# Patient Record
Sex: Female | Born: 1990
Health system: Southern US, Community
[De-identification: ages and names within clinical notes are randomized; demographics above are authoritative.]

## PROBLEM LIST (undated history)

## (undated) DIAGNOSIS — E282 Polycystic ovarian syndrome: Secondary | ICD-10-CM

## (undated) HISTORY — DX: Polycystic ovarian syndrome: E28.2

## (undated) HISTORY — PX: JOINT REPLACEMENT: SHX530

---
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
## 2013-07-10 ENCOUNTER — Encounter (HOSPITAL_COMMUNITY): Payer: Self-pay | Admitting: Emergency Medicine

## 2013-07-10 ENCOUNTER — Emergency Department (HOSPITAL_COMMUNITY)
Admission: EM | Admit: 2013-07-10 | Discharge: 2013-07-10 | Disposition: A | Discharge status: Home / Self Care | Payer: BC Managed Care – PPO | Source: Home / Self Care | Attending: Emergency Medicine | Admitting: Emergency Medicine

## 2013-07-10 DIAGNOSIS — Y9289 Other specified places as the place of occurrence of the external cause: Secondary | ICD-10-CM

## 2013-07-10 DIAGNOSIS — S335XXA Sprain of ligaments of lumbar spine, initial encounter: Secondary | ICD-10-CM

## 2013-07-10 DIAGNOSIS — W19XXXA Unspecified fall, initial encounter: Secondary | ICD-10-CM

## 2013-07-10 DIAGNOSIS — S39012A Strain of muscle, fascia and tendon of lower back, initial encounter: Secondary | ICD-10-CM

## 2013-07-10 MED ORDER — IBUPROFEN 800 MG PO TABS: 800.0000 mg | ORAL_TABLET | Freq: Three times a day (TID) | ORAL | Status: DC | PRN

## 2013-07-10 MED ORDER — CYCLOBENZAPRINE HCL 10 MG PO TABS: 10.0000 mg | ORAL_TABLET | Freq: Three times a day (TID) | ORAL | Status: DC | PRN

## 2013-07-10 NOTE — ED Provider Notes (Signed)
CSN: [id number]     Arrival date & time 07/10/13  4098 History   First MD Initiated Contact with Patient 07/10/13 [phone]     Chief Complaint  Patient presents with  . Back Pain   (Consider location/radiation/quality/duration/timing/severity/associated sxs/prior Treatment) HPI Comments: 23 year old female presents complaining of possible lower back muscle strain. She was bending over while working in a barn earlier today when she felt a sharp cold and had immediate pain across her lower back. She fell to the ground and it took her a few minutes to get up. She is now having pain that is localized to the right side of her lower back. She denies any numbness or weakness in her extremities. No loss of bowel or bladder control. No radiation of the pain down her legs. She has never had this before.  Patient is a 22 y.o. female presenting with back pain.  Back Pain   History reviewed. No pertinent past medical history. History reviewed. No pertinent past surgical history. History reviewed. No pertinent family history. History  Substance Use Topics  . Smoking status: Never Smoker   . Smokeless tobacco: Not on file  . Alcohol Use: Yes   OB History   Grav Para Term Preterm Abortions TAB SAB Ect Mult Living                 Review of Systems  Musculoskeletal: Positive for back pain.  All other systems reviewed and are negative.   Allergies  Sulfa antibiotics  Home Medications   Prior to Admission medications   Medication Sig Start Date End Date Taking? Authorizing Provider  cyclobenzaprine (FLEXERIL) 10 MG tablet Take 1 tablet (10 mg total) by mouth 3 (three) times daily as needed for muscle spasms. 07/10/13   Graylon Good, PA-C  ibuprofen (ADVIL,MOTRIN) 800 MG tablet Take 1 tablet (800 mg total) by mouth every 8 (eight) hours as needed. 07/10/13   Adrian Blackwater Hermine Feria, PA-C   BP 138/84  Pulse 79  Temp(Src) 99.2 F (37.3 C) (Oral)  Resp 18  SpO2 100%  LMP 07/03/2013 Physical Exam   Nursing note and vitals reviewed. Constitutional: She is oriented to person, place, and time. Vital signs are normal. She appears well-developed and well-nourished. No distress.  HENT:  Head: Normocephalic and atraumatic.  Pulmonary/Chest: Effort normal. No respiratory distress.  Musculoskeletal:       Lumbar back: She exhibits decreased range of motion (Decreased flexion), tenderness (very mild tenderness in the right side of her lower back paraspinous musculature) and pain. She exhibits no bony tenderness, no swelling, no edema, no deformity and no spasm.  Neurological: She is alert and oriented to person, place, and time. She has normal strength. No cranial nerve deficit or sensory deficit. She exhibits normal muscle tone. Coordination and gait normal.  Reflex Scores:      Patellar reflexes are 2+ on the right side and 2+ on the left side. Skin: Skin is warm and dry. No rash noted. She is not diaphoretic.  Psychiatric: She has a normal mood and affect. Judgment normal.    ED Course  Procedures (including critical care time) Labs Review Labs Reviewed - No data to display  Imaging Review No results found.   MDM   1. Strain of lumbar paraspinal muscle, initial encounter    Treat with 800 mg ibuprofen and Flexeril when necessary. Ice, rest as needed. Return to work as tolerated. Should be a minor, self-limited problem  Meds ordered this encounter  Medications  .  ibuprofen (ADVIL,MOTRIN) 800 MG tablet    Sig: Take 1 tablet (800 mg total) by mouth every 8 (eight) hours as needed.    Dispense:  30 tablet    Refill:  0    Order Specific Question:  Supervising Provider    Answer:  Lorenz CoasterKELLER, DAVID C [id number]]  . cyclobenzaprine (FLEXERIL) 10 MG tablet    Sig: Take 1 tablet (10 mg total) by mouth 3 (three) times daily as needed for muscle spasms.    Dispense:  21 tablet    Refill:  0    Order Specific Question:  Supervising Provider    Answer:  Lorenz CoasterKELLER, DAVID C [[id number]]          Graylon GoodZachary H Lakendra Helling, PA-C 07/10/13 [phone]

## 2013-07-10 NOTE — Discharge Instructions (Signed)
Back Exercises °Back exercises help treat and prevent back injuries. The goal of back exercises is to increase the strength of your abdominal and back muscles and the flexibility of your back. These exercises should be started when you no longer have back pain. Back exercises include: °· Pelvic Tilt. Lie on your back with your knees bent. Tilt your pelvis until the lower part of your back is against the floor. Hold this position 5 to 10 sec and repeat 5 to 10 times. °· Knee to Chest. Pull first 1 knee up against your chest and hold for 20 to 30 seconds, repeat this with the other knee, and then both knees. This may be done with the other leg straight or bent, whichever feels better. °· Sit-Ups or Curl-Ups. Bend your knees 90 degrees. Start with tilting your pelvis, and do a partial, slow sit-up, lifting your trunk only 30 to 45 degrees off the floor. Take at least 2 to 3 seconds for each sit-up. Do not do sit-ups with your knees out straight. If partial sit-ups are difficult, simply do the above but with only tightening your abdominal muscles and holding it as directed. °· Hip-Lift. Lie on your back with your knees flexed 90 degrees. Push down with your feet and shoulders as you raise your hips a couple inches off the floor; hold for 10 seconds, repeat 5 to 10 times. °· Back arches. Lie on your stomach, propping yourself up on bent elbows. Slowly press on your hands, causing an arch in your low back. Repeat 3 to 5 times. Any initial stiffness and discomfort should lessen with repetition over time. °· Shoulder-Lifts. Lie face down with arms beside your body. Keep hips and torso pressed to floor as you slowly lift your head and shoulders off the floor. °Do not overdo your exercises, especially in the beginning. Exercises may cause you some mild back discomfort which lasts for a few minutes; however, if the pain is more severe, or lasts for more than 15 minutes, do not continue exercises until you see your caregiver.  Improvement with exercise therapy for back problems is slow.  °See your caregivers for assistance with developing a proper back exercise program. °Document Released: 01/27/2004 Document Revised: 03/13/2011 Document Reviewed: 10/20/2010 °ExitCare® Patient Information ©2015 [employer]. This information is not intended to replace advice given to you by your health care provider. Make sure you discuss any questions you have with your health care provider. ° ° °Lumbosacral Strain °Lumbosacral strain is a strain of any of the parts that make up your lumbosacral vertebrae. Your lumbosacral vertebrae are the bones that make up the lower third of your backbone. Your lumbosacral vertebrae are held together by muscles and tough, fibrous tissue (ligaments).  °CAUSES  °A sudden blow to your back can cause lumbosacral strain. Also, anything that causes an excessive stretch of the muscles in the low back can cause this strain. This is typically seen when people exert themselves strenuously, fall, lift heavy objects, bend, or crouch repeatedly. °RISK FACTORS °· Physically demanding work. °· Participation in pushing or pulling sports or sports that require a sudden twist of the back (tennis, golf, baseball). °· Weight lifting. °· Excessive lower back curvature. °· Forward-tilted pelvis. °· Weak back or abdominal muscles or both. °· Tight hamstrings. °SIGNS AND SYMPTOMS  °Lumbosacral strain may cause pain in the area of your injury or pain that moves (radiates) down your leg.  °DIAGNOSIS °Your health care provider can often diagnose lumbosacral strain through a   physical exam. In some cases, you may need tests such as X-ray exams.  °TREATMENT  °Treatment for your lower back injury depends on many factors that your clinician will have to evaluate. However, most treatment will include the use of anti-inflammatory medicines. °HOME CARE INSTRUCTIONS  °· Avoid hard physical activities (tennis, racquetball, waterskiing) if you are not in  proper physical condition for it. This may aggravate or create problems. °· If you have a back problem, avoid sports requiring sudden body movements. Swimming and walking are generally safer activities. °· Maintain good posture. °· Maintain a healthy weight. °· For acute conditions, you may put ice on the injured area. °¨ Put ice in a plastic bag. °¨ Place a towel between your skin and the bag. °¨ Leave the ice on for 20 minutes, 2-3 times a day. °· When the low back starts healing, stretching and strengthening exercises may be recommended. °SEEK MEDICAL CARE IF: °· Your back pain is getting worse. °· You experience severe back pain not relieved with medicines. °SEEK IMMEDIATE MEDICAL CARE IF:  °· You have numbness, tingling, weakness, or problems with the use of your arms or legs. °· There is a change in bowel or bladder control. °· You have increasing pain in any area of the body, including your belly (abdomen). °· You notice shortness of breath, dizziness, or feel faint. °· You feel sick to your stomach (nauseous), are throwing up (vomiting), or become sweaty. °· You notice discoloration of your toes or legs, or your feet get very cold. °MAKE SURE YOU:  °· Understand these instructions. °· Will watch your condition. °· Will get help right away if you are not doing well or get worse. °Document Released: 09/28/2004 Document Revised: 12/24/2012 Document Reviewed: 08/07/2012 °ExitCare® Patient Information ©2015 [employer]. This information is not intended to replace advice given to you by your health care provider. Make sure you discuss any questions you have with your health care provider. ° °

## 2013-07-10 NOTE — ED Provider Notes (Signed)
Medical screening examination/treatment/procedure(s) were performed by non-physician practitioner and as supervising physician I was immediately available for consultation/collaboration.  Annlee Glandon, M.D.  Muriel Wilber C Samiyyah Moffa, MD 07/10/13 0939 

## 2013-07-10 NOTE — ED Notes (Addendum)
Pt reports       She  Bent over       And  Felt  Pain in  Her  Back  Worse  On movement  And  Certain  posistions    denys  Any  Urinary  Symptoms       Pain   Was  Worse  Initially     Better  Now

## 2014-01-07 ENCOUNTER — Encounter: Payer: Self-pay | Admitting: Family Medicine

## 2014-01-07 ENCOUNTER — Ambulatory Visit (INDEPENDENT_AMBULATORY_CARE_PROVIDER_SITE_OTHER): Payer: BLUE CROSS/BLUE SHIELD | Admitting: Family Medicine

## 2014-01-07 VITALS — BP 149/79 | HR 90 | Temp 97.6°F | Ht 63.0 in | Wt 162.0 lb

## 2014-01-07 DIAGNOSIS — G540 Brachial plexus disorders: Secondary | ICD-10-CM

## 2014-01-07 MED ORDER — IBUPROFEN 800 MG PO TABS: 800.0000 mg | ORAL_TABLET | Freq: Three times a day (TID) | ORAL | Status: AC | PRN

## 2014-01-07 NOTE — Patient Instructions (Signed)
Thoracic Outlet Syndrome Thoracic outlet syndrome is a condition caused by the compressing of the nerves and blood vessels between the muscles of the neck and the shoulders. Work activities involving prolonged restricted postures such as carrying heavy shoulder loads, pulling shoulders back and down, or reaching above shoulder level for prolonged periods of time can cause the inflammation (soreness) and swelling of tendons (cord like structures which attach muscle to bone) and muscles in the shoulders and upper arms. When swollen or inflamed, they can compress the nerves and blood vessels between the neck and shoulders.  SYMPTOMS   Pain.  Arm weakness.  Numbness in the arm and fingers.  Sense of touch or the ability to feel heat and cold may be lost. DIAGNOSIS  The diagnosis is made by medical history and physical examination. Special tests can confirm the diagnosis.  TREATMENT   A carefully planned program of exercise therapy.  Applying ice packs for 15-20 minutes, 03-04 times per day, may be useful.  Avoid work activities suspected of causing the condition.  Only take over-the-counter or prescription medicines for pain, discomfort, or fever as directed by your caregiver.  Surgery may be necessary if symptoms persist. PREVENTION   Avoid carrying heavy weights.  Avoid reaching overhead.  Avoid lifting with the arms above shoulder level. SEEK IMMEDIATE MEDICAL CARE IF:   You have an increase in swelling or pain in your arm.  You notice coldness in your fingers.  Pain relief is not obtained with medications. Document Released: 12/09/2001 Document Revised: 03/13/2011 Document Reviewed: 12/19/2004 ExitCare Patient Information 2015 [employer]. This information is not intended to replace advice given to you by your health care provider. Make sure you discuss any questions you have with your health care provider.  

## 2014-01-07 NOTE — Progress Notes (Signed)
.     Subjective:    Patient ID: Heather Arias, female    DOB: 08/21/1990, 24 y.o.   MRN: [id number]  HPI Patient c/o left shoulder and arm discomfort.  She has been lifting heavy bales of hay and she states she gets numb in her finger tips.  Review of Systems  Constitutional: Negative for fever.  HENT: Negative for ear pain.   Eyes: Negative for discharge.  Respiratory: Negative for cough.   Cardiovascular: Negative for chest pain.  Gastrointestinal: Negative for abdominal distention.  Endocrine: Negative for polyuria.  Genitourinary: Negative for difficulty urinating.  Musculoskeletal: Negative for gait problem and neck pain.  Skin: Negative for color change and rash.  Neurological: Negative for speech difficulty and headaches.  Psychiatric/Behavioral: Negative for agitation.       Objective:    BP 149/79 mmHg  Pulse 90  Temp(Src) 97.6 F (36.4 C) (Oral)  Ht 5\' 3"  (1.6 m)  Wt 162 lb (73.483 kg)  BMI 28.70 kg/m2  LMP 12/10/2013 (Approximate) Physical Exam  TTP left deltoid and left SITS muscles.  She has decreased radial pulse and her left hand develops pallor after raising left arm above head. Normal ROM left shoulder and negative NEER and HAWKINS FROM left shoulder without tenderness.      Assessment & Plan:     ICD-9-CM ICD-10-CM   1. Thoracic outlet syndrome 353.0 G54.0   Motrin 800mg  one po tid x 7 days Avoid heavy lifting for a week.   No Follow-up on file.  Deatra CanterWilliam J Karyn Brull FNP

## 2014-01-21 ENCOUNTER — Ambulatory Visit: Payer: Self-pay | Admitting: Family Medicine

## 2014-04-01 ENCOUNTER — Ambulatory Visit (INDEPENDENT_AMBULATORY_CARE_PROVIDER_SITE_OTHER): Payer: BLUE CROSS/BLUE SHIELD | Admitting: Family

## 2014-04-01 ENCOUNTER — Encounter (INDEPENDENT_AMBULATORY_CARE_PROVIDER_SITE_OTHER): Payer: Self-pay

## 2014-04-01 ENCOUNTER — Encounter: Payer: Self-pay | Admitting: Family

## 2014-04-01 VITALS — BP 136/83 | HR 82 | Temp 97.0°F | Ht 63.0 in | Wt 157.8 lb

## 2014-04-01 DIAGNOSIS — R05 Cough: Secondary | ICD-10-CM | POA: Diagnosis not present

## 2014-04-01 DIAGNOSIS — J069 Acute upper respiratory infection, unspecified: Secondary | ICD-10-CM | POA: Diagnosis not present

## 2014-04-01 DIAGNOSIS — G540 Brachial plexus disorders: Secondary | ICD-10-CM

## 2014-04-01 DIAGNOSIS — R059 Cough, unspecified: Secondary | ICD-10-CM

## 2014-04-01 MED ORDER — KETOROLAC TROMETHAMINE 60 MG/2ML IM SOLN
60.0000 mg | Freq: Once | INTRAMUSCULAR | Status: AC
  Administered 2014-04-01: 60 mg via INTRAMUSCULAR

## 2014-04-01 MED ORDER — METHYLPREDNISOLONE (PAK) 4 MG PO TABS: ORAL_TABLET | ORAL | Status: DC

## 2014-04-01 MED ORDER — BENZONATATE 200 MG PO CAPS: 200.0000 mg | ORAL_CAPSULE | Freq: Three times a day (TID) | ORAL | Status: DC | PRN

## 2014-04-01 NOTE — Patient Instructions (Signed)
Upper Respiratory Infection, Adult An upper respiratory infection (URI) is also sometimes known as the common cold. The upper respiratory tract includes the nose, sinuses, throat, trachea, and bronchi. Bronchi are the airways leading to the lungs. Most people improve within 1 week, but symptoms can last up to 2 weeks. A residual cough may last even longer.  CAUSES Many different viruses can infect the tissues lining the upper respiratory tract. The tissues become irritated and inflamed and often become very moist. Mucus production is also common. A cold is contagious. You can easily spread the virus to others by oral contact. This includes kissing, sharing a glass, coughing, or sneezing. Touching your mouth or nose and then touching a surface, which is then touched by another person, can also spread the virus. SYMPTOMS  Symptoms typically develop 1 to 3 days after you come in contact with a cold virus. Symptoms vary from person to person. They may include:  Runny nose.  Sneezing.  Nasal congestion.  Sinus irritation.  Sore throat.  Loss of voice (laryngitis).  Cough.  Fatigue.  Muscle aches.  Loss of appetite.  Headache.  Low-grade fever. DIAGNOSIS  You might diagnose your own cold based on familiar symptoms, since most people get a cold 2 to 3 times a year. Your caregiver can confirm this based on your exam. Most importantly, your caregiver can check that your symptoms are not due to another disease such as strep throat, sinusitis, pneumonia, asthma, or epiglottitis. Blood tests, throat tests, and X-rays are not necessary to diagnose a common cold, but they may sometimes be helpful in excluding other more serious diseases. Your caregiver will decide if any further tests are required. RISKS AND COMPLICATIONS  You may be at risk for a more severe case of the common cold if you smoke cigarettes, have chronic heart disease (such as heart failure) or lung disease (such as asthma), or if  you have a weakened immune system. The very young and very old are also at risk for more serious infections. Bacterial sinusitis, middle ear infections, and bacterial pneumonia can complicate the common cold. The common cold can worsen asthma and chronic obstructive pulmonary disease (COPD). Sometimes, these complications can require emergency medical care and may be life-threatening. PREVENTION  The best way to protect against getting a cold is to practice good hygiene. Avoid oral or hand contact with people with cold symptoms. Wash your hands often if contact occurs. There is no clear evidence that vitamin C, vitamin E, echinacea, or exercise reduces the chance of developing a cold. However, it is always recommended to get plenty of rest and practice good nutrition. TREATMENT  Treatment is directed at relieving symptoms. There is no cure. Antibiotics are not effective, because the infection is caused by a virus, not by bacteria. Treatment may include:  Increased fluid intake. Sports drinks offer valuable electrolytes, sugars, and fluids.  Breathing heated mist or steam (vaporizer or shower).  Eating chicken soup or other clear broths, and maintaining good nutrition.  Getting plenty of rest.  Using gargles or lozenges for comfort.  Controlling fevers with ibuprofen or acetaminophen as directed by your caregiver.  Increasing usage of your inhaler if you have asthma. Zinc gel and zinc lozenges, taken in the first 24 hours of the common cold, can shorten the duration and lessen the severity of symptoms. Pain medicines may help with fever, muscle aches, and throat pain. A variety of non-prescription medicines are available to treat congestion and runny nose. Your caregiver   can make recommendations and may suggest nasal or lung inhalers for other symptoms.  HOME CARE INSTRUCTIONS   Only take over-the-counter or prescription medicines for pain, discomfort, or fever as directed by your  caregiver.  Use a warm mist humidifier or inhale steam from a shower to increase air moisture. This may keep secretions moist and make it easier to breathe.  Drink enough water and fluids to keep your urine clear or pale yellow.  Rest as needed.  Return to work when your temperature has returned to normal or as your caregiver advises. You may need to stay home longer to avoid infecting others. You can also use a face mask and careful hand washing to prevent spread of the virus. SEEK MEDICAL CARE IF:   After the first few days, you feel you are getting worse rather than better.  You need your caregiver's advice about medicines to control symptoms.  You develop chills, worsening shortness of breath, or brown or red sputum. These may be signs of pneumonia.  You develop yellow or brown nasal discharge or pain in the face, especially when you bend forward. These may be signs of sinusitis.  You develop a fever, swollen neck glands, pain with swallowing, or white areas in the back of your throat. These may be signs of strep throat. SEEK IMMEDIATE MEDICAL CARE IF:   You have a fever.  You develop severe or persistent headache, ear pain, sinus pain, or chest pain.  You develop wheezing, a prolonged cough, cough up blood, or have a change in your usual mucus (if you have chronic lung disease).  You develop sore muscles or a stiff neck. Document Released: 06/14/2000 Document Revised: 03/13/2011 Document Reviewed: 03/26/2013 ExitCare Patient Information 2015 [employer]. This information is not intended to replace advice given to you by your health care provider. Make sure you discuss any questions you have with your health care provider.  - Take meds as prescribed - Use a cool mist humidifier  -Use saline nose sprays frequently -Saline irrigations of the nose can be very helpful if done frequently.  * 4X daily for 1 week*  * Use of a nettie pot can be helpful with this. Follow  directions with this* -Force fluids -For any cough or congestion  Use plain Mucinex- regular strength or max strength is fine   * Children- consult with Pharmacist for dosing -For fever or aces or pains- take tylenol or ibuprofen appropriate for age and weight.  * for fevers greater than 101 orally you may alternate ibuprofen and tylenol every  3 hours. -Throat lozenges if help   Verlee Pope, FNP   

## 2014-04-01 NOTE — Progress Notes (Signed)
Subjective:    Patient ID: Heather Arias, female    DOB: 1991-01-02, 24 y.o.   MRN: [id number]  Cough This is a new problem. The current episode started in the past 7 days. The problem has been gradually worsening. The problem occurs every few minutes. The cough is productive of purulent sputum. Associated symptoms include ear congestion, ear pain, myalgias, rhinorrhea, a sore throat and wheezing. Pertinent negatives include no chills, fever, headaches, nasal congestion or shortness of breath. The symptoms are aggravated by lying down and pollens. She has tried rest and OTC cough suppressant for the symptoms. The treatment provided mild relief. There is no history of asthma.  Otalgia  Associated symptoms include coughing, rhinorrhea and a sore throat. Pertinent negatives include no headaches.      Review of Systems  Constitutional: Negative.  Negative for fever and chills.  HENT: Positive for ear pain, rhinorrhea and sore throat.   Eyes: Negative.   Respiratory: Positive for cough and wheezing. Negative for shortness of breath.   Cardiovascular: Negative.  Negative for palpitations.  Gastrointestinal: Negative.   Endocrine: Negative.   Genitourinary: Negative.   Musculoskeletal: Positive for myalgias.  Neurological: Negative.  Negative for headaches.  Hematological: Negative.   Psychiatric/Behavioral: Negative.   All other systems reviewed and are negative.      Objective:   Physical Exam  Constitutional: She is oriented to person, place, and time. She appears well-developed and well-nourished. No distress.  HENT:  Head: Normocephalic and atraumatic.  Right Ear: External ear normal.  Left Ear: External ear normal.  Nasal passage erythemas with mild swelling  Oropharynx erythemas  Eyes: Pupils are equal, round, and reactive to light.  Neck: Normal range of motion. Neck supple. No thyromegaly present.  Cardiovascular: Normal rate, regular rhythm, normal heart sounds and  intact distal pulses.   No murmur heard. Pulmonary/Chest: Effort normal and breath sounds normal. No respiratory distress. She has no wheezes.  Abdominal: Soft. Bowel sounds are normal. She exhibits no distension. There is no tenderness.  Musculoskeletal: Normal range of motion. She exhibits no edema or tenderness.  Neurological: She is alert and oriented to person, place, and time. She has normal reflexes. No cranial nerve deficit.  Skin: Skin is warm and dry.  Psychiatric: She has a normal mood and affect. Her behavior is normal. Judgment and thought content normal.  Vitals reviewed.   BP 136/83 mmHg  Pulse 82  Temp(Src) 97 F (36.1 C) (Oral)  Ht 5\' 3"  (1.6 m)  Wt 157 lb 12.8 oz (71.578 kg)  BMI 27.96 kg/m2  LMP 03/12/2014       Assessment & Plan:  1. Acute upper respiratory infection -- Take meds as prescribed - Use a cool mist humidifier  -Use saline nose sprays frequently -Saline irrigations of the nose can be very helpful if done frequently.  * 4X daily for 1 week*  * Use of a nettie pot can be helpful with this. Follow directions with this* -Force fluids -For any cough or congestion  Use plain Mucinex- regular strength or max strength is fine   * Children- consult with Pharmacist for dosing -For fever or aces or pains- take tylenol or ibuprofen appropriate for age and weight.  * for fevers greater than 101 orally you may alternate ibuprofen and tylenol every  3 hours. -Throat lozenges if help - methylPREDNIsolone (MEDROL DOSPACK) 4 MG tablet; follow package directions  Dispense: 21 tablet; Refill: 0  2. Cough - benzonatate (TESSALON) 200 MG capsule;  Take 1 capsule (200 mg total) by mouth 3 (three) times daily as needed.  Dispense: 30 capsule; Refill: 1   3. Thoracic outlet syndrome - ketorolac (TORADOL) injection 60 mg; Inject 2 mLs (60 mg total) into the muscle once.  Jannifer Rodney, FNP

## 2014-12-22 ENCOUNTER — Encounter: Payer: Self-pay | Admitting: Family Medicine

## 2014-12-22 ENCOUNTER — Ambulatory Visit (INDEPENDENT_AMBULATORY_CARE_PROVIDER_SITE_OTHER): Payer: 59 | Admitting: Family Medicine

## 2014-12-22 VITALS — BP 129/77 | HR 98 | Temp 97.5°F | Ht 63.0 in | Wt 170.4 lb

## 2014-12-22 DIAGNOSIS — R5382 Chronic fatigue, unspecified: Secondary | ICD-10-CM | POA: Diagnosis not present

## 2014-12-22 DIAGNOSIS — R350 Frequency of micturition: Secondary | ICD-10-CM

## 2014-12-22 DIAGNOSIS — R5383 Other fatigue: Secondary | ICD-10-CM | POA: Insufficient documentation

## 2014-12-22 LAB — POCT UA - MICROSCOPIC ONLY
Bacteria, U Microscopic: NEGATIVE
Casts, Ur, LPF, POC: NEGATIVE
Crystals, Ur, HPF, POC: NEGATIVE
Mucus, UA: NEGATIVE
RBC, urine, microscopic: NEGATIVE
WBC, Ur, HPF, POC: NEGATIVE
Yeast, UA: NEGATIVE

## 2014-12-22 LAB — POCT URINALYSIS DIPSTICK
Bilirubin, UA: NEGATIVE
Blood, UA: NEGATIVE
Glucose, UA: NEGATIVE
Ketones, UA: NEGATIVE
Leukocytes, UA: NEGATIVE
Nitrite, UA: NEGATIVE
Protein, UA: NEGATIVE
Spec Grav, UA: 1.015
Urobilinogen, UA: NEGATIVE
pH, UA: 6.5

## 2014-12-22 NOTE — Progress Notes (Signed)
   HPI  Patient presents today Here with fatigue  She has a 3-4 month Hx of generalized faitigue, polyuria, increased hunger, shaking, and sweet smelling urine.   She has been concerned with that she is developing diabetes, so she has used her coworkers, or to check a random glucose which was 87 about an hour after eating.  She states that her usual blood pressure is 130s over 80s No chest pain, dyspnea, palpitations, leg edema.  No hair changes, nail changes, or rash. She denies anxiety, depression, and anhedonia. Her sleep is interrupted by frequent urination.  She was started on spironolactone about one year ago by her GYN which she has discontinued after using it for a few weeks.  She exercises regularly and states that despite this she cannot seem to lose weight.  PMH: Smoking status noted ROS: Per HPI  Objective: BP 129/77 mmHg  Pulse 98  Temp(Src) 97.5 F (36.4 C) (Oral)  Ht 5' 3" (1.6 m)  Wt 170 lb 6.4 oz (77.293 kg)  BMI 30.19 kg/m2 Gen: NAD, alert, cooperative with exam HEENT: NCAT, TMs normal bilaterally, nares clear, oropharynx clear Neck: No thyromegaly or tenderness lymphadenopathyood S1/S2, no murmur Resp: CTABL, no wheezes, non-labored Abd: SNTND, BS present, no guarding or organomegaly, no suprapubic pressure  Ext: No edema, warm Neuro: Alert and oriented, No gross deficits  Assessment and plan:  # generalized fatigue, frequent urination Unclear etiology I have low suspicion for diabetes Fasting labs, including thyroid No clear mood disorder Plan basic lab workup and return in one month.*  # Thoracic outlet syndrome Left greater than right hand numbness, however both hands have median nerve distribution I've recommended a trial of cock-up splint's to see if carpal tunnel could be the main culprit, however we will keep an eye on this and consider referral if her symptoms persist   Orders Placed This Encounter  Procedures  . CBC with Differential    . CMP14+EGFR  . TSH  . T4, Free  . POCT urinalysis dipstick  . POCT UA - Microscopic Only    No orders of the defined types were placed in this encounter.    Laroy Apple, MD [facility] 12/22/2014, 8:24 AM

## 2014-12-22 NOTE — Patient Instructions (Signed)
Great to meet you!  We will call with your labs within a week.   Come back in 1 month.

## 2014-12-22 NOTE — Addendum Note (Signed)
Addended by: Prescott GumLAND, Chananya Canizalez M on: 12/22/2014 08:28 AM   Modules accepted: Kipp BroodSmartSet

## 2014-12-23 LAB — CBC WITH DIFFERENTIAL/PLATELET
Basophils Absolute: 0 10*3/uL (ref 0.0–0.2)
Basos: 0 %
EOS (ABSOLUTE): 0.1 10*3/uL (ref 0.0–0.4)
Eos: 3 %
Hematocrit: 42.9 % (ref 34.0–46.6)
Hemoglobin: 14.2 g/dL (ref 11.1–15.9)
Immature Grans (Abs): 0 10*3/uL (ref 0.0–0.1)
Immature Granulocytes: 0 %
Lymphocytes Absolute: 1.5 10*3/uL (ref 0.7–3.1)
Lymphs: 34 %
MCH: 32.5 pg (ref 26.6–33.0)
MCHC: 33.1 g/dL (ref 31.5–35.7)
MCV: 98 fL — ABNORMAL HIGH (ref 79–97)
Monocytes Absolute: 0.3 10*3/uL (ref 0.1–0.9)
Monocytes: 8 %
Neutrophils Absolute: 2.5 10*3/uL (ref 1.4–7.0)
Neutrophils: 55 %
Platelets: 264 10*3/uL (ref 150–379)
RBC: 4.37 x10E6/uL (ref 3.77–5.28)
RDW: 12.7 % (ref 12.3–15.4)
WBC: 4.5 10*3/uL (ref 3.4–10.8)

## 2014-12-23 LAB — CMP14+EGFR
ALT: 10 IU/L (ref 0–32)
AST: 17 IU/L (ref 0–40)
Albumin/Globulin Ratio: 2 (ref 1.1–2.5)
Albumin: 4.5 g/dL (ref 3.5–5.5)
Alkaline Phosphatase: 73 IU/L (ref 39–117)
BUN/Creatinine Ratio: 12 (ref 8–20)
BUN: 9 mg/dL (ref 6–20)
Bilirubin Total: 0.3 mg/dL (ref 0.0–1.2)
CO2: 20 mmol/L (ref 18–29)
Calcium: 9.3 mg/dL (ref 8.7–10.2)
Chloride: 104 mmol/L (ref 96–106)
Creatinine, Ser: 0.75 mg/dL (ref 0.57–1.00)
GFR calc Af Amer: 130 mL/min/{1.73_m2} (ref >59)
GFR calc non Af Amer: 113 mL/min/{1.73_m2} (ref >59)
Globulin, Total: 2.3 g/dL (ref 1.5–4.5)
Glucose: 92 mg/dL (ref 65–99)
Potassium: 4.3 mmol/L (ref 3.5–5.2)
Sodium: 141 mmol/L (ref 134–144)
Total Protein: 6.8 g/dL (ref 6.0–8.5)

## 2014-12-23 LAB — TSH: TSH: 2.78 u[IU]/mL (ref 0.450–4.500)

## 2014-12-23 LAB — T4, FREE: Free T4: 1 ng/dL (ref 0.82–1.77)

## 2015-01-25 ENCOUNTER — Ambulatory Visit: Payer: BLUE CROSS/BLUE SHIELD | Admitting: Family Medicine

## 2015-04-02 ENCOUNTER — Encounter: Payer: 59 | Admitting: Nurse Practitioner

## 2015-04-02 ENCOUNTER — Telehealth: Payer: Self-pay

## 2015-04-02 MED ORDER — FLUCONAZOLE 150 MG PO TABS: ORAL_TABLET | ORAL | Status: DC

## 2015-04-02 NOTE — Telephone Encounter (Signed)
Patient is on antibiotic for a dental infection, penicillin.  Has wisdom teeth removed next week and will start on another 10 day round of antibiotics.  She thinks she has a yeast infection at this time.  She has irritation, odor, and itching.  She would like for us to call in medication for a yeast infection and give her refills so that when she takes the 2nd antibiotic she will have extra in case she needs them.  [facility] send to [facility].

## 2015-04-03 HISTORY — PX: WISDOM TOOTH EXTRACTION: SHX21

## 2015-04-05 ENCOUNTER — Encounter: Payer: Self-pay | Admitting: Family

## 2015-07-20 ENCOUNTER — Ambulatory Visit (INDEPENDENT_AMBULATORY_CARE_PROVIDER_SITE_OTHER): Payer: 59 | Admitting: Family Medicine

## 2015-07-20 ENCOUNTER — Encounter: Payer: Self-pay | Admitting: Family Medicine

## 2015-07-20 VITALS — BP 130/81 | HR 80 | Temp 97.4°F | Ht 63.0 in | Wt 170.0 lb

## 2015-07-20 DIAGNOSIS — R1013 Epigastric pain: Secondary | ICD-10-CM | POA: Diagnosis not present

## 2015-07-20 LAB — URINALYSIS, COMPLETE
Bilirubin, UA: NEGATIVE
Glucose, UA: NEGATIVE
Ketones, UA: NEGATIVE
Nitrite, UA: NEGATIVE
Protein, UA: NEGATIVE
Specific Gravity, UA: 1.02 (ref 1.005–1.030)
Urobilinogen, Ur: 0.2 mg/dL (ref 0.2–1.0)
pH, UA: 6 (ref 5.0–7.5)

## 2015-07-20 LAB — MICROSCOPIC EXAMINATION: Epithelial Cells (non renal): >10 /hpf — AB (ref 0–10)

## 2015-07-20 LAB — PREGNANCY, URINE: Preg Test, Ur: NEGATIVE

## 2015-07-20 NOTE — Progress Notes (Signed)
   HPI  Patient presents today here with epigastric abdominal pain.  Patient explains that over the last 2 days she's had intermittent epigastric abdominal pain.  It started insidiously at work, lasting about 6 hours off and on similar to an upset stomach. Later that night it hurt very badly, she considered going to the emergency room, she talked to her grandmother gave her Gas-X and had her pace the floor which helped quite a bit.  She denies any dysuria, vaginal bleeding, diarrhea, vomiting, fevers, or chills. She has not been drinking, she has not drank alcohol in at least a month.  Seeded with eating, it's hurt intermittently throughout the day and is not clearly associated reading. She's tolerating food easily.  PMH: Smoking status noted ROS: Per HPI  Objective: BP 130/81 mmHg  Pulse 80  Temp(Src) 97.4 F (36.3 C) (Oral)  Ht _0  (1.6 m)  Wt 170 lb (77.111 kg)  BMI 30.12 kg/m2  LMP 06/22/2015 (Approximate) Gen: NAD, alert, cooperative with exam HEENT: NCAT CV: RRR, good S1/S2, no murmur Resp: CTABL, no wheezes, non-labored Abd: Soft, mild tenderness to palpation in the right lower quadrant, positive bowel sounds, no guarding, negative Murphy sign Ext: No edema, warm Neuro: Alert and oriented, No gross deficits  Assessment and plan:  # Epigastric abdominal pain Unclear etiology, abdominal exam is reassuring Most likely gastritis, start Nexium daily 1 week, consider also constipation, gallstones or pancreatitis. I think it's unlikely to be acute appendicitis given that she's tolerating foods so easily in her abdominal exam is so benign Labs Urinalysis today only with trace leukocytes, will send for culture        Orders Placed This Encounter  Procedures  . Pregnancy, urine  . Urinalysis, Complete  . CMP14+EGFR  . CBC with Differential/Platelet  . Lipase    Laroy Apple, MD [facility] 07/20/2015, 6:54 PM

## 2015-07-20 NOTE — Patient Instructions (Signed)
Great to see you!  We will call with results within 1 week  Abdominal Pain, Adult Many things can cause abdominal pain. Usually, abdominal pain is not caused by a disease and will improve without treatment. It can often be observed and treated at home. Your health care provider will do a physical exam and possibly order blood tests and X-rays to help determine the seriousness of your pain. However, in many cases, more time must pass before a clear cause of the pain can be found. Before that point, your health care provider may not know if you need more testing or further treatment. HOME CARE INSTRUCTIONS Monitor your abdominal pain for any changes. The following actions may help to alleviate any discomfort you are experiencing:  Only take over-the-counter or prescription medicines as directed by your health care provider.  Do not take laxatives unless directed to do so by your health care provider.  Try a clear liquid diet (broth, tea, or water) as directed by your health care provider. Slowly move to a bland diet as tolerated. SEEK MEDICAL CARE IF:  You have unexplained abdominal pain.  You have abdominal pain associated with nausea or diarrhea.  You have pain when you urinate or have a bowel movement.  You experience abdominal pain that wakes you in the night.  You have abdominal pain that is worsened or improved by eating food.  You have abdominal pain that is worsened with eating fatty foods.  You have a fever. SEEK IMMEDIATE MEDICAL CARE IF:  Your pain does not go away within 2 hours.  You keep throwing up (vomiting).  Your pain is felt only in portions of the abdomen, such as the right side or the left lower portion of the abdomen.  You pass bloody or black tarry stools. MAKE SURE YOU:  Understand these instructions.  Will watch your condition.  Will get help right away if you are not doing well or get worse.   This information is not intended to replace advice  given to you by your health care provider. Make sure you discuss any questions you have with your health care provider.   Document Released: 09/28/2004 Document Revised: 09/09/2014 Document Reviewed: 08/28/2012 Elsevier Interactive Patient Education [employer]vier Inc.

## 2015-07-22 ENCOUNTER — Telehealth: Payer: Self-pay | Admitting: Family

## 2015-07-22 LAB — CBC WITH DIFFERENTIAL/PLATELET
Basophils Absolute: 0 10*3/uL (ref 0.0–0.2)
Basos: 0 %
EOS (ABSOLUTE): 0.2 10*3/uL (ref 0.0–0.4)
Eos: 3 %
Hematocrit: 44.4 % (ref 34.0–46.6)
Hemoglobin: 14.5 g/dL (ref 11.1–15.9)
Immature Grans (Abs): 0 10*3/uL (ref 0.0–0.1)
Immature Granulocytes: 0 %
Lymphocytes Absolute: 2.3 10*3/uL (ref 0.7–3.1)
Lymphs: 38 %
MCH: 32.4 pg (ref 26.6–33.0)
MCHC: 32.7 g/dL (ref 31.5–35.7)
MCV: 99 fL — ABNORMAL HIGH (ref 79–97)
Monocytes Absolute: 0.5 10*3/uL (ref 0.1–0.9)
Monocytes: 8 %
Neutrophils Absolute: 3.2 10*3/uL (ref 1.4–7.0)
Neutrophils: 51 %
Platelets: 283 10*3/uL (ref 150–379)
RBC: 4.48 x10E6/uL (ref 3.77–5.28)
RDW: 12.4 % (ref 12.3–15.4)
WBC: 6.2 10*3/uL (ref 3.4–10.8)

## 2015-07-22 LAB — CMP14+EGFR
ALT: 11 IU/L (ref 0–32)
AST: 15 IU/L (ref 0–40)
Albumin/Globulin Ratio: 2 (ref 1.2–2.2)
Albumin: 4.4 g/dL (ref 3.5–5.5)
Alkaline Phosphatase: 62 IU/L (ref 39–117)
BUN/Creatinine Ratio: 8 — ABNORMAL LOW (ref 9–23)
BUN: 7 mg/dL (ref 6–20)
Bilirubin Total: 0.4 mg/dL (ref 0.0–1.2)
CO2: 20 mmol/L (ref 18–29)
Calcium: 9.2 mg/dL (ref 8.7–10.2)
Chloride: 102 mmol/L (ref 96–106)
Creatinine, Ser: 0.88 mg/dL (ref 0.57–1.00)
GFR calc Af Amer: 107 mL/min/{1.73_m2} (ref >59)
GFR calc non Af Amer: 93 mL/min/{1.73_m2} (ref >59)
Globulin, Total: 2.2 g/dL (ref 1.5–4.5)
Glucose: 87 mg/dL (ref 65–99)
Potassium: 3.8 mmol/L (ref 3.5–5.2)
Sodium: 139 mmol/L (ref 134–144)
Total Protein: 6.6 g/dL (ref 6.0–8.5)

## 2015-07-22 LAB — LIPASE: Lipase: 24 U/L (ref 0–59)

## 2015-07-22 NOTE — Telephone Encounter (Signed)
Pt aware.

## 2015-07-23 ENCOUNTER — Other Ambulatory Visit: Payer: Self-pay | Admitting: Family Medicine

## 2015-07-23 ENCOUNTER — Telehealth: Payer: Self-pay | Admitting: Family Medicine

## 2015-07-23 LAB — URINE CULTURE

## 2015-07-23 MED ORDER — CIPROFLOXACIN HCL 250 MG PO TABS: 250.0000 mg | ORAL_TABLET | Freq: Two times a day (BID) | ORAL | Status: DC

## 2015-07-23 MED ORDER — FLUCONAZOLE 150 MG PO TABS: 150.0000 mg | ORAL_TABLET | Freq: Once | ORAL | Status: DC

## 2015-07-23 NOTE — Telephone Encounter (Signed)
Called patient to discuss culture results.  Initially it said 25-50,000 colony-forming units, after enterococcus was identified I felt it best to go ahead and treat.  She states that her pain is mostly better. She is having some intermittent abdominal pain.  We'll treat as a simple UTI with Cipro 3 days. Also given Diflucan for anticipated yeast infection.  Patient will call back if she has any additional issues.  Murtis SinkSam Bradshaw, MD [facility] 07/23/2015, 5:05 PM

## 2015-08-31 ENCOUNTER — Ambulatory Visit (INDEPENDENT_AMBULATORY_CARE_PROVIDER_SITE_OTHER): Payer: 59 | Admitting: Nurse Practitioner

## 2015-08-31 ENCOUNTER — Encounter: Payer: Self-pay | Admitting: Nurse Practitioner

## 2015-08-31 VITALS — BP 125/81 | HR 79 | Temp 96.9°F | Ht 63.0 in | Wt 172.0 lb

## 2015-08-31 DIAGNOSIS — J029 Acute pharyngitis, unspecified: Secondary | ICD-10-CM | POA: Diagnosis not present

## 2015-08-31 LAB — CULTURE, GROUP A STREP

## 2015-08-31 LAB — RAPID STREP SCREEN (MED CTR MEBANE ONLY): Strep Gp A Ag, IA W/Reflex: NEGATIVE

## 2015-08-31 NOTE — Patient Instructions (Signed)

## 2015-08-31 NOTE — Progress Notes (Signed)
Subjective:     Heather Arias is a 25 y.o. female who presents for evaluation of sore throat. Associated symptoms include sinus and nasal congestion, sore throat and trouble swallowing. Onset of symptoms was 2 days ago, and have been gradually worsening since that time. She is drinking plenty of fluids. She has had a recent close exposure to someone with proven streptococcal pharyngitis.  The following portions of the patient's history were reviewed and updated as appropriate: allergies, current medications, past family history, past medical history, past social history, past surgical history and problem list.  Review of Systems Pertinent items noted in HPI and remainder of comprehensive ROS otherwise negative.    Objective:    BP 125/81   Pulse 79   Temp (!) 96.9 F (36.1 C) (Oral)   Ht 5\' 3"  (1.6 m)   Wt 172 lb (78 kg)   BMI 30.47 kg/m  General appearance: alert and cooperative Eyes: conjunctivae/corneas clear. PERRL, EOM's intact. Fundi benign. Ears: normal TM's and external ear canals both ears Nose: clear discharge, mild congestion, turbinates pale Throat: abnormal findings: mild oropharyngeal erythema Lungs: clear to auscultation bilaterally Heart: regular rate and rhythm, S1, S2 normal, no murmur, click, rub or gallop  Laboratory Strep test done. Results:negative.    Assessment:    Acute pharyngitis, likely  Viral pharyngitis.    Plan:     Take medication as prescribe Cotton underwear Take shower not bath Cranberry juice, yogurt Force fluids AZO over the counter X2 days Culture pending RTO prn  Mary-Margaret Daphine DeutscherMartin, FNP

## 2015-12-02 ENCOUNTER — Encounter: Payer: Self-pay | Admitting: Family Medicine

## 2015-12-02 ENCOUNTER — Ambulatory Visit (INDEPENDENT_AMBULATORY_CARE_PROVIDER_SITE_OTHER): Payer: 59 | Admitting: Family Medicine

## 2015-12-02 VITALS — BP 124/84 | HR 97

## 2015-12-02 DIAGNOSIS — J029 Acute pharyngitis, unspecified: Secondary | ICD-10-CM | POA: Diagnosis not present

## 2015-12-02 LAB — RAPID STREP SCREEN (MED CTR MEBANE ONLY): Strep Gp A Ag, IA W/Reflex: NEGATIVE

## 2015-12-02 LAB — CULTURE, GROUP A STREP

## 2015-12-02 NOTE — Progress Notes (Signed)
BP 124/84   Pulse 97    Subjective:    Patient ID: Heather Arias, female    DOB: 1990-01-28, 25 y.o.   MRN: [id number]  HPI: Heather FreestoneBillie J Harbold is a 25 y.o. female presenting on 12/02/2015 for Sore throat, headache, bilateral ear pain   HPI Sore throat and headache and congestion Patient has been having sore throat and headache and congestion And going on for the past day. She denies any fevers or chills. She did feel warm last night but did not take her temperature. She denies shortness of breath or wheezing. She denies any sick contacts that she knows of but she does work at [employer]. She has taken ibuprofen to help with the swelling.  Relevant past medical, surgical, family and social history reviewed and updated as indicated. Interim medical history since our last visit reviewed. Allergies and medications reviewed and updated.  Review of Systems  Constitutional: Negative for chills and fever.  HENT: Positive for congestion, postnasal drip, rhinorrhea, sinus pressure and sore throat. Negative for ear discharge, ear pain and sneezing.   Eyes: Negative for pain, redness and visual disturbance.  Respiratory: Negative for cough, chest tightness and shortness of breath.   Cardiovascular: Negative for chest pain and leg swelling.  Genitourinary: Negative for difficulty urinating and dysuria.  Musculoskeletal: Negative for back pain and gait problem.  Skin: Negative for rash.  Neurological: Negative for light-headedness and headaches.  Psychiatric/Behavioral: Negative for agitation and behavioral problems.  All other systems reviewed and are negative.   Per HPI unless specifically indicated above      Objective:    BP 124/84   Pulse 97   Wt Readings from Last 3 Encounters:  08/31/15 172 lb (78 kg)  07/20/15 170 lb (77.1 kg)  12/22/14 170 lb 6.4 oz (77.3 kg)    Physical Exam  Constitutional: She is oriented to person, place, and time. She appears well-developed and  well-nourished. No distress.  HENT:  Right Ear: Tympanic membrane, external ear and ear canal normal.  Left Ear: Tympanic membrane, external ear and ear canal normal.  Nose: Mucosal edema and rhinorrhea present. No epistaxis. Right sinus exhibits no maxillary sinus tenderness and no frontal sinus tenderness. Left sinus exhibits no maxillary sinus tenderness and no frontal sinus tenderness.  Mouth/Throat: Uvula is midline and mucous membranes are normal. Posterior oropharyngeal edema and posterior oropharyngeal erythema present. No oropharyngeal exudate or tonsillar abscesses.  Eyes: Conjunctivae are normal.  Cardiovascular: Normal rate, regular rhythm, normal heart sounds and intact distal pulses.   No murmur heard. Pulmonary/Chest: Effort normal and breath sounds normal. No respiratory distress. She has no wheezes. She has no rales.  Musculoskeletal: Normal range of motion. She exhibits no edema or tenderness.  Neurological: She is alert and oriented to person, place, and time. Coordination normal.  Skin: Skin is warm and dry. No rash noted. She is not diaphoretic.  Psychiatric: She has a normal mood and affect. Her behavior is normal.  Vitals reviewed.      Assessment & Plan:   Problem List Items Addressed This Visit    None    Visit Diagnoses    Viral pharyngitis    -  Primary   Recommended Flonase and Mucinex and nasal saline, call if worsens or develops any high fevers.   Relevant Orders   Rapid strep screen (not at [facility])       Follow up plan: Return if symptoms worsen or fail to improve.  Counseling provided  for all of the vaccine components Orders Placed This Encounter  Procedures  . Rapid strep screen (not at [facility])    Arville CareJoshua Dettinger, MD [facility] 12/02/2015, 9:41 AM

## 2016-02-08 DIAGNOSIS — M542 Cervicalgia: Secondary | ICD-10-CM | POA: Diagnosis not present

## 2016-08-11 DIAGNOSIS — R635 Abnormal weight gain: Secondary | ICD-10-CM | POA: Diagnosis not present

## 2016-08-11 DIAGNOSIS — R5383 Other fatigue: Secondary | ICD-10-CM | POA: Diagnosis not present

## 2016-08-11 DIAGNOSIS — Z6834 Body mass index (BMI) 34.0-34.9, adult: Secondary | ICD-10-CM | POA: Diagnosis not present

## 2016-08-11 DIAGNOSIS — Z01419 Encounter for gynecological examination (general) (routine) without abnormal findings: Secondary | ICD-10-CM | POA: Diagnosis not present

## 2016-08-11 DIAGNOSIS — Z130 Encounter for screening for diseases of the blood and blood-forming organs and certain disorders involving the immune mechanism: Secondary | ICD-10-CM | POA: Diagnosis not present

## 2016-11-07 DIAGNOSIS — R102 Pelvic and perineal pain: Secondary | ICD-10-CM | POA: Diagnosis not present

## 2016-11-07 DIAGNOSIS — R350 Frequency of micturition: Secondary | ICD-10-CM | POA: Diagnosis not present

## 2016-11-10 DIAGNOSIS — N760 Acute vaginitis: Secondary | ICD-10-CM | POA: Diagnosis not present

## 2016-11-10 DIAGNOSIS — Z113 Encounter for screening for infections with a predominantly sexual mode of transmission: Secondary | ICD-10-CM | POA: Diagnosis not present

## 2016-11-10 DIAGNOSIS — Z118 Encounter for screening for other infectious and parasitic diseases: Secondary | ICD-10-CM | POA: Diagnosis not present

## 2016-12-01 DIAGNOSIS — R102 Pelvic and perineal pain: Secondary | ICD-10-CM | POA: Diagnosis not present

## 2018-09-06 DIAGNOSIS — E282 Polycystic ovarian syndrome: Secondary | ICD-10-CM | POA: Insufficient documentation

## 2018-09-13 ENCOUNTER — Other Ambulatory Visit: Payer: Self-pay

## 2018-09-16 ENCOUNTER — Encounter: Payer: Self-pay | Admitting: Family

## 2018-09-16 ENCOUNTER — Ambulatory Visit (INDEPENDENT_AMBULATORY_CARE_PROVIDER_SITE_OTHER): Payer: 59 | Admitting: Family

## 2018-09-16 ENCOUNTER — Other Ambulatory Visit: Payer: Self-pay

## 2018-09-16 VITALS — BP 126/96 | HR 76 | Temp 98.9°F | Resp 20 | Ht 63.0 in | Wt 212.0 lb

## 2018-09-16 DIAGNOSIS — Z713 Dietary counseling and surveillance: Secondary | ICD-10-CM | POA: Diagnosis not present

## 2018-09-16 DIAGNOSIS — R748 Abnormal levels of other serum enzymes: Secondary | ICD-10-CM

## 2018-09-16 DIAGNOSIS — R635 Abnormal weight gain: Secondary | ICD-10-CM | POA: Insufficient documentation

## 2018-09-16 DIAGNOSIS — E282 Polycystic ovarian syndrome: Secondary | ICD-10-CM

## 2018-09-16 DIAGNOSIS — E669 Obesity, unspecified: Secondary | ICD-10-CM | POA: Insufficient documentation

## 2018-09-16 DIAGNOSIS — Z6837 Body mass index (BMI) 37.0-37.9, adult: Secondary | ICD-10-CM

## 2018-09-16 NOTE — Progress Notes (Signed)
Subjective:    Patient ID: Heather Arias, female    DOB: 07-17-1990, 28 y.o.   MRN: [id number]  Chief Complaint  Patient presents with  . recheck lab work    HPI PT presents to the office today for abnormal lab values from her GYN. She states they found that she had PCOS with cysts on her ovaries, and testosterone elevated, and her ALT was elevated to 41.    She was started on Spironolactone 25 mg  Daily. She reports mild pelvic pain in certain positions, but overall denies any pain at this time.   She states she has started a diet over the last week and has lost 4 lbs. She states she has been trying to lose weight over the last year and seems to lose about 10 lbs and then becomes at a standstill.    Review of Systems  All other systems reviewed and are negative.      Objective:   Physical Exam Vitals signs reviewed.  Constitutional:      General: She is not in acute distress.    Appearance: She is well-developed.  HENT:     Head: Normocephalic and atraumatic.     Right Ear: Tympanic membrane normal.     Left Ear: Tympanic membrane normal.  Eyes:     Pupils: Pupils are equal, round, and reactive to light.  Neck:     Musculoskeletal: Normal range of motion and neck supple.     Thyroid: No thyromegaly.  Cardiovascular:     Rate and Rhythm: Normal rate and regular rhythm.     Heart sounds: Normal heart sounds. No murmur.  Pulmonary:     Effort: Pulmonary effort is normal. No respiratory distress.     Breath sounds: Normal breath sounds. No wheezing.  Abdominal:     General: Bowel sounds are normal. There is no distension.     Palpations: Abdomen is soft.     Tenderness: There is no abdominal tenderness.  Musculoskeletal: Normal range of motion.        General: No tenderness.  Skin:    General: Skin is warm and dry.  Neurological:     Mental Status: She is alert and oriented to person, place, and time.     Cranial Nerves: No cranial nerve deficit.     Deep Tendon  Reflexes: Reflexes are normal and symmetric.  Psychiatric:        Behavior: Behavior normal.        Thought Content: Thought content normal.        Judgment: Judgment normal.       BP (!) 126/96   Pulse 76   Temp 98.9 F (37.2 C) (Temporal)   Resp 20   Ht _0  (1.6 m)   Wt 212 lb (96.2 kg)   LMP 09/09/2018   SpO2 100%   BMI 37.55 kg/m      Assessment & Plan:  Heather Arias comes in today with chief complaint of recheck lab work   Diagnosis and orders addressed:  1. PCOS (polycystic ovarian syndrome) Continue spironolactone daily Keep follow up with GYN Continue to lose weight - CBC with Differential/Platelet - BMP8+EGFR  2. Elevated liver enzymes Labs pending Avoid tylenol and alcohol Low fat diet - CBC with Differential/Platelet - BMP8+EGFR - Hepatic function panel - Hepatitis panel, acute  3. Class 2 severe obesity due to excess calories with serious comorbidity and body mass index (BMI) of 37.0 to 37.9 in adult [facility])  4. Weight loss counseling, encounter for Discussed if liver enzymes normal we could think about prescribing phentermine  Low fat diet and exercise daily     Evelina Dun, FNP

## 2018-09-16 NOTE — Patient Instructions (Signed)

## 2018-09-17 LAB — CBC WITH DIFFERENTIAL/PLATELET
Basophils Absolute: 0 10*3/uL (ref 0.0–0.2)
Basos: 1 %
EOS (ABSOLUTE): 0.2 10*3/uL (ref 0.0–0.4)
Eos: 2 %
Hematocrit: 41.4 % (ref 34.0–46.6)
Hemoglobin: 13.9 g/dL (ref 11.1–15.9)
Immature Grans (Abs): 0 10*3/uL (ref 0.0–0.1)
Immature Granulocytes: 0 %
Lymphocytes Absolute: 2.1 10*3/uL (ref 0.7–3.1)
Lymphs: 33 %
MCH: 32.9 pg (ref 26.6–33.0)
MCHC: 33.6 g/dL (ref 31.5–35.7)
MCV: 98 fL — ABNORMAL HIGH (ref 79–97)
Monocytes Absolute: 0.6 10*3/uL (ref 0.1–0.9)
Monocytes: 9 %
Neutrophils Absolute: 3.5 10*3/uL (ref 1.4–7.0)
Neutrophils: 55 %
Platelets: 238 10*3/uL (ref 150–450)
RBC: 4.23 x10E6/uL (ref 3.77–5.28)
RDW: 12 % (ref 11.7–15.4)
WBC: 6.3 10*3/uL (ref 3.4–10.8)

## 2018-09-17 LAB — HEPATITIS PANEL, ACUTE
Hep A IgM: NEGATIVE
Hep B C IgM: NEGATIVE
Hep C Virus Ab: <0.1 s/co ratio (ref 0.0–0.9)
Hepatitis B Surface Ag: NEGATIVE

## 2018-09-17 LAB — HEPATIC FUNCTION PANEL
ALT: 20 IU/L (ref 0–32)
AST: 19 IU/L (ref 0–40)
Albumin: 4.4 g/dL (ref 3.9–5.0)
Alkaline Phosphatase: 86 IU/L (ref 39–117)
Bilirubin Total: 0.2 mg/dL (ref 0.0–1.2)
Bilirubin, Direct: 0.07 mg/dL (ref 0.00–0.40)
Total Protein: 6.8 g/dL (ref 6.0–8.5)

## 2018-09-17 LAB — BMP8+EGFR
BUN/Creatinine Ratio: 15 (ref 9–23)
BUN: 11 mg/dL (ref 6–20)
CO2: 21 mmol/L (ref 20–29)
Calcium: 9.3 mg/dL (ref 8.7–10.2)
Chloride: 107 mmol/L — ABNORMAL HIGH (ref 96–106)
Creatinine, Ser: 0.71 mg/dL (ref 0.57–1.00)
GFR calc Af Amer: 136 mL/min/{1.73_m2} (ref >59)
GFR calc non Af Amer: 118 mL/min/{1.73_m2} (ref >59)
Glucose: 86 mg/dL (ref 65–99)
Potassium: 4.5 mmol/L (ref 3.5–5.2)
Sodium: 140 mmol/L (ref 134–144)

## 2018-09-19 ENCOUNTER — Other Ambulatory Visit: Payer: Self-pay | Admitting: Family

## 2018-09-19 MED ORDER — PHENTERMINE HCL 37.5 MG PO TABS: 37.5000 mg | ORAL_TABLET | Freq: Every day | ORAL | 2 refills | Status: DC

## 2018-10-11 ENCOUNTER — Other Ambulatory Visit: Payer: Self-pay | Admitting: *Deleted

## 2018-10-11 DIAGNOSIS — Z20822 Contact with and (suspected) exposure to covid-19: Secondary | ICD-10-CM

## 2018-10-11 DIAGNOSIS — R0902 Hypoxemia: Secondary | ICD-10-CM

## 2018-10-13 LAB — NOVEL CORONAVIRUS, NAA: SARS-CoV-2, NAA: DETECTED — AB

## 2018-10-14 ENCOUNTER — Telehealth: Payer: Self-pay | Admitting: Family

## 2018-10-14 NOTE — Telephone Encounter (Signed)
Pt had already had her questions answered

## 2018-12-05 ENCOUNTER — Ambulatory Visit: Payer: 59 | Admitting: Family

## 2019-08-11 ENCOUNTER — Telehealth: Payer: Self-pay | Admitting: Family

## 2019-08-14 NOTE — Telephone Encounter (Signed)
Printed shot record off epic and ncir / left message

## 2019-12-30 DIAGNOSIS — Z01419 Encounter for gynecological examination (general) (routine) without abnormal findings: Secondary | ICD-10-CM | POA: Diagnosis not present

## 2019-12-30 DIAGNOSIS — Z124 Encounter for screening for malignant neoplasm of cervix: Secondary | ICD-10-CM | POA: Diagnosis not present

## 2019-12-30 DIAGNOSIS — Z6837 Body mass index (BMI) 37.0-37.9, adult: Secondary | ICD-10-CM | POA: Diagnosis not present

## 2019-12-30 DIAGNOSIS — E221 Hyperprolactinemia: Secondary | ICD-10-CM | POA: Diagnosis not present

## 2019-12-30 DIAGNOSIS — E282 Polycystic ovarian syndrome: Secondary | ICD-10-CM | POA: Diagnosis not present

## 2020-04-28 DIAGNOSIS — M9901 Segmental and somatic dysfunction of cervical region: Secondary | ICD-10-CM | POA: Diagnosis not present

## 2020-04-28 DIAGNOSIS — M9903 Segmental and somatic dysfunction of lumbar region: Secondary | ICD-10-CM | POA: Diagnosis not present

## 2020-04-28 DIAGNOSIS — M25512 Pain in left shoulder: Secondary | ICD-10-CM | POA: Diagnosis not present

## 2020-05-31 ENCOUNTER — Encounter (HOSPITAL_COMMUNITY): Payer: Self-pay | Admitting: Emergency Medicine

## 2020-05-31 ENCOUNTER — Other Ambulatory Visit: Payer: Self-pay

## 2020-05-31 ENCOUNTER — Emergency Department (HOSPITAL_COMMUNITY): Payer: BC Managed Care – PPO

## 2020-05-31 ENCOUNTER — Emergency Department (HOSPITAL_COMMUNITY)
Admission: EM | Admit: 2020-05-31 | Discharge: 2020-05-31 | Disposition: A | Discharge status: Home / Self Care | Payer: BC Managed Care – PPO | Attending: Emergency Medicine | Admitting: Emergency Medicine

## 2020-05-31 DIAGNOSIS — R109 Unspecified abdominal pain: Secondary | ICD-10-CM | POA: Diagnosis not present

## 2020-05-31 DIAGNOSIS — R1012 Left upper quadrant pain: Secondary | ICD-10-CM | POA: Insufficient documentation

## 2020-05-31 DIAGNOSIS — R112 Nausea with vomiting, unspecified: Secondary | ICD-10-CM | POA: Diagnosis not present

## 2020-05-31 DIAGNOSIS — R1011 Right upper quadrant pain: Secondary | ICD-10-CM | POA: Diagnosis not present

## 2020-05-31 DIAGNOSIS — K760 Fatty (change of) liver, not elsewhere classified: Secondary | ICD-10-CM | POA: Diagnosis not present

## 2020-05-31 DIAGNOSIS — R1013 Epigastric pain: Secondary | ICD-10-CM | POA: Diagnosis not present

## 2020-05-31 DIAGNOSIS — R8281 Pyuria: Secondary | ICD-10-CM | POA: Diagnosis not present

## 2020-05-31 DIAGNOSIS — R6883 Chills (without fever): Secondary | ICD-10-CM | POA: Diagnosis not present

## 2020-05-31 LAB — CBC WITH DIFFERENTIAL/PLATELET
Abs Immature Granulocytes: 0.02 10*3/uL (ref 0.00–0.07)
Basophils Absolute: 0 10*3/uL (ref 0.0–0.1)
Basophils Relative: 1 %
Eosinophils Absolute: 0.1 10*3/uL (ref 0.0–0.5)
Eosinophils Relative: 2 %
HCT: 40.4 % (ref 36.0–46.0)
Hemoglobin: 13.5 g/dL (ref 12.0–15.0)
Immature Granulocytes: 0 %
Lymphocytes Relative: 31 %
Lymphs Abs: 2.3 10*3/uL (ref 0.7–4.0)
MCH: 32.5 pg (ref 26.0–34.0)
MCHC: 33.4 g/dL (ref 30.0–36.0)
MCV: 97.1 fL (ref 80.0–100.0)
Monocytes Absolute: 0.5 10*3/uL (ref 0.1–1.0)
Monocytes Relative: 7 %
Neutro Abs: 4.5 10*3/uL (ref 1.7–7.7)
Neutrophils Relative %: 59 %
Platelets: 248 10*3/uL (ref 150–400)
RBC: 4.16 MIL/uL (ref 3.87–5.11)
RDW: 12.3 % (ref 11.5–15.5)
WBC: 7.5 10*3/uL (ref 4.0–10.5)
nRBC: 0 % (ref 0.0–0.2)

## 2020-05-31 LAB — URINALYSIS, ROUTINE W REFLEX MICROSCOPIC
Bilirubin Urine: NEGATIVE
Glucose, UA: NEGATIVE mg/dL
Hgb urine dipstick: NEGATIVE
Ketones, ur: NEGATIVE mg/dL
Leukocytes,Ua: NEGATIVE
Nitrite: NEGATIVE
Protein, ur: NEGATIVE mg/dL
Specific Gravity, Urine: 1.021 (ref 1.005–1.030)
pH: 5 (ref 5.0–8.0)

## 2020-05-31 LAB — COMPREHENSIVE METABOLIC PANEL WITH GFR
ALT: 27 U/L (ref 0–44)
AST: 21 U/L (ref 15–41)
Albumin: 4.4 g/dL (ref 3.5–5.0)
Alkaline Phosphatase: 82 U/L (ref 38–126)
Anion gap: 8 (ref 5–15)
BUN: 8 mg/dL (ref 6–20)
CO2: 25 mmol/L (ref 22–32)
Calcium: 9.7 mg/dL (ref 8.9–10.3)
Chloride: 107 mmol/L (ref 98–111)
Creatinine, Ser: 0.69 mg/dL (ref 0.44–1.00)
GFR, Estimated: >60 mL/min
Glucose, Bld: 100 mg/dL — ABNORMAL HIGH (ref 70–99)
Potassium: 4.1 mmol/L (ref 3.5–5.1)
Sodium: 140 mmol/L (ref 135–145)
Total Bilirubin: 0.3 mg/dL (ref 0.3–1.2)
Total Protein: 7.4 g/dL (ref 6.5–8.1)

## 2020-05-31 LAB — I-STAT BETA HCG BLOOD, ED (MC, WL, AP ONLY): I-stat hCG, quantitative: <5 m[IU]/mL (ref <5)

## 2020-05-31 LAB — LIPASE, BLOOD: Lipase: 24 U/L (ref 11–51)

## 2020-05-31 MED ORDER — ACETAMINOPHEN 325 MG PO TABS: 650.0000 mg | ORAL_TABLET | Freq: Once | ORAL | Status: DC

## 2020-05-31 MED ORDER — LIDOCAINE VISCOUS HCL 2 % MT SOLN
15.0000 mL | Freq: Once | OROMUCOSAL | Status: AC
  Administered 2020-05-31: 15 mL via ORAL
  Filled 2020-05-31: qty 15

## 2020-05-31 MED ORDER — ALUM & MAG HYDROXIDE-SIMETH 200-200-20 MG/5ML PO SUSP
30.0000 mL | Freq: Once | ORAL | Status: AC
  Administered 2020-05-31: 30 mL via ORAL
  Filled 2020-05-31: qty 30

## 2020-05-31 NOTE — ED Provider Notes (Signed)
Emergency Medicine Provider Triage Evaluation Note  Heather Arias , a 30 y.o. female  was evaluated in triage.  Pt complains of bilateral upper abdominal pain and epigastric pain radiating into her chest.  Pain began yesterday after she had eaten some food, improved, however returned again this morning after she tried to eat something again.  She has had nausea, nonbloody nonbilious vomiting.  No prior history of reflux.  She does have her gallbladder.  No known fevers but has felt chilled.  Denies any dysuria or hematuria.  No pelvic pain.  No vaginal bleeding or discharge.  Denies any recent drug or alcohol use.  Review of Systems  Positive: As above Negative: As above  Physical Exam  BP (!) 164/105 (BP Location: Right Arm)   Pulse 100   Temp 99.2 F (37.3 C) (Oral)   Resp 16   Ht 5\' 3"  (1.6 m)   Wt 95 kg   SpO2 98%   BMI 37.10 kg/m  Gen:   Awake, no distress   Resp:  Normal effort  MSK:   Moves extremities without difficulty  Other:  Soft, nontender abdomen, negative Murphy's  Medical Decision Making  Medically screening exam initiated at 3:49 PM.  Appropriate orders placed.  Heather Arias was informed that the remainder of the evaluation will be completed by another provider, this initial triage assessment does not replace that evaluation, and the importance of remaining in the ED until their evaluation is complete.     Marcelino Freestone, PA-C 05/31/20 1550    06/02/20, DO 05/31/20 2152

## 2020-05-31 NOTE — Discharge Instructions (Addendum)
You were evaluated in the Emergency Department and after careful evaluation, we did not find any emergent condition requiring admission or further testing in the hospital.  Your work-up today was overall reassuring.  Your right upper quadrant ultrasound did not show any evidence of gallstones, though it did show evidence of possible fatty liver disease.  This is nonemergent, our radiologist recommends that you have an outpatient MRI or repeat ultrasound which can be done with your primary care doctor.  I suspect that your symptoms could be due to reflux.  You may take over-the-counter medicine such as Pepcid, omeprazole.  Please avoid fatty foods.  Please return to the Emergency Department if you experience any worsening of your condition.  We encourage you to follow up with a primary care provider.  Thank you for allowing [place] to be a part of your care.'

## 2020-05-31 NOTE — ED Provider Notes (Signed)
[facility]-EMERGENCY DEPT Provider Note   CSN: [id number] Arrival date & time: 05/31/20  1516     History Chief Complaint  Patient presents with  . Abdominal Pain    Heather Arias is a 30 y.o. female.  HPI 30 year old female with history of obesity, PCOS presents to the ER with complaints of bilateral upper abdominal pain and epigastric pain radiating into her chest.  Pain began yesterday after she had eaten some food, improved, however returned again this morning after she tried to eat something again.  She has had nausea, nonbloody nonbilious vomiting.  No prior history of reflux.  She does have her gallbladder.  No known fevers but has felt chilled.  Denies any dysuria or hematuria.  No pelvic pain.  No vaginal bleeding or discharge.  Denies any recent drug or alcohol use.    History reviewed. No pertinent past medical history.  Patient Active Problem List   Diagnosis Date Noted  . Weight gain 09/16/2018  . Obese 09/16/2018  . PCOS (polycystic ovarian syndrome) 09/06/2018  . Fatigue 12/22/2014    Past Surgical History:  Procedure Laterality Date  . WISDOM TOOTH EXTRACTION  April 2017     OB History   No obstetric history on file.     Family History  Problem Relation Age of Onset  . Heart disease Maternal Grandmother     Social History   Tobacco Use  . Smoking status: Never Smoker  . Smokeless tobacco: Never Used  Substance Use Topics  . Alcohol use: Yes    Home Medications Prior to Admission medications   Medication Sig Start Date End Date Taking? Authorizing Provider  BLISOVI 24 FE 1-20 MG-MCG(24) tablet  09/06/18   [provider]  ELDERBERRY PO     [provider]  ibuprofen (ADVIL,MOTRIN) 800 MG tablet Take 1 tablet (800 mg total) by mouth every 8 (eight) hours as needed. 01/07/14   Deatra Canter, FNP  Lysine 1000 MG TABS Take by mouth.    [provider]  phentermine (ADIPEX-P) 37.5 MG tablet Take 1  tablet (37.5 mg total) by mouth daily before breakfast. 09/19/18   Junie Spencer, FNP  Prenatal Vit-Fe Fumarate-FA (PRENATAL VITAMINS PO)     [provider]  spironolactone (ALDACTONE) 25 MG tablet  09/06/18   [provider]  VITAMIN D PO     [provider]    Allergies    Sulfa antibiotics  Review of Systems   Review of Systems  Constitutional: Negative for chills and fever.  HENT: Negative for ear pain and sore throat.   Eyes: Negative for pain and visual disturbance.  Respiratory: Negative for cough and shortness of breath.   Cardiovascular: Negative for chest pain and palpitations.  Gastrointestinal: Positive for abdominal pain, nausea and vomiting.  Genitourinary: Negative for dysuria and hematuria.  Musculoskeletal: Negative for arthralgias and back pain.  Skin: Negative for color change and rash.  Neurological: Negative for seizures and syncope.  All other systems reviewed and are negative.   Physical Exam Updated Vital Signs BP (!) 150/105   Pulse 79   Temp 99.2 F (37.3 C) (Oral)   Resp 16   Ht 5\' 3"  (1.6 m)   Wt 95 kg   SpO2 98%   BMI 37.10 kg/m   Physical Exam Vitals and nursing note reviewed.  Constitutional:      General: She is not in acute distress.    Appearance: She is well-developed. She  is not ill-appearing or diaphoretic.  HENT:     Head: Normocephalic and atraumatic.  Eyes:     Conjunctiva/sclera: Conjunctivae normal.  Cardiovascular:     Rate and Rhythm: Normal rate and regular rhythm.     Heart sounds: No murmur heard.   Pulmonary:     Effort: Pulmonary effort is normal. No respiratory distress.     Breath sounds: Normal breath sounds.  Abdominal:     Palpations: Abdomen is soft.     Tenderness: There is no abdominal tenderness. There is no right CVA tenderness or left CVA tenderness. Negative signs include Murphy's sign.  Musculoskeletal:     Cervical back: Neck supple.  Skin:    General: Skin is warm  and dry.  Neurological:     Mental Status: She is alert.     ED Results / Procedures / Treatments   Labs (all labs ordered are listed, but only abnormal results are displayed) Labs Reviewed  COMPREHENSIVE METABOLIC PANEL - Abnormal; Notable for the following components:      Result Value   Glucose, Bld 100 (*)    All other components within normal limits  URINALYSIS, ROUTINE W REFLEX MICROSCOPIC - Abnormal; Notable for the following components:   APPearance HAZY (*)    All other components within normal limits  CBC WITH DIFFERENTIAL/PLATELET  LIPASE, BLOOD  I-STAT BETA HCG BLOOD, ED ([facility], [facility], [facility] ONLY)    EKG None  Radiology US Abdomen Limited RUQ (LIVER/GB)  Result Date: 05/31/2020 CLINICAL DATA:  30 year old female with abdominal pain. EXAM: ULTRASOUND ABDOMEN LIMITED RIGHT UPPER QUADRANT COMPARISON:  None. FINDINGS: Gallbladder: No gallstones or wall thickening visualized. No sonographic Murphy sign noted by sonographer. Common bile duct: Diameter: 3 mm Liver: There is diffuse increased liver echogenicity most commonly seen in the setting of fatty infiltration. There is a 3.4 x 2.4 x 2.5 cm hypoechoic area in the right lobe with no increased internal vascularity which is not characterized but may represent a focal fat sparing. Follow-up with ultrasound in 3 months or further evaluation with MRI is recommended. Portal vein is patent on color Doppler imaging with normal direction of blood flow towards the liver. Other: None. IMPRESSION: 1. No gallstone. 2. Fatty liver with probable focal area of fat sparing. Follow-up with ultrasound or further evaluation with MRI on a nonemergent/outpatient basis is recommended. Electronically Signed   By: Elgie Collard M.D.   On: 05/31/2020 17:40    Procedures Procedures   Medications Ordered in ED Medications  acetaminophen (TYLENOL) tablet 650 mg (has no administration in time range)  alum & mag hydroxide-simeth (MAALOX/MYLANTA) 200-200-20  MG/5ML suspension 30 mL (30 mLs Oral Given 05/31/20 1651)    And  lidocaine (XYLOCAINE) 2 % viscous mouth solution 15 mL (15 mLs Oral Given 05/31/20 1651)    ED Course  I have reviewed the triage vital signs and the nursing notes.  Pertinent labs & imaging results that were available during my care of the patient were reviewed by me and considered in my medical decision making (see chart for details).    MDM Rules/Calculators/A&P                         30 year old female with complaints of upper abdominal pain.  On arrival, she is well-appearing, no acute distress, resting comfortably in ER bed.  Slightly hypertensive but otherwise vitals are reassuring.  Physical exam is benign, no tenderness on exam, no CVA tenderness, negative Eulah Pont  sign.  Labs and imaging ordered, reviewed and interpreted by me.  CBC without leukocytosis, CMP unremarkable.  UA without evidence of UTI or blood.  Lipase is normal.  Pregnancy is negative.  I discussed the reassuring findings with the patient, I do suspect possible GERD.  Patient's mother at bedside states that she does have a history of gallstones.  Patient does not have any tenderness, but I do not think it is unreasonable to get a right upper quadrant ultrasound.  Upper quadrant ultrasound without evidence of gallstones.  Does have evidence of fatty liver.  Patient was informed of these findings, encouraged PCP follow-up.  Recommended outpatient repeat ultrasound or MRI.  Patient's pain under control.  Encouraged use of over-the-counter antacids.  We discussed return precautions.  She was understanding and is agreeable.  Stable for discharge.    Final Clinical Impression(s) / ED Diagnoses Final diagnoses:  RUQ pain  Abdominal pain, unspecified abdominal location    Rx / DC Orders ED Discharge Orders    None       Mare Ferrari, PA-C 05/31/20 1802    Virgina Norfolk, DO 05/31/20 2152

## 2020-05-31 NOTE — ED Triage Notes (Signed)
Pt states she had stomach pain, nausea, vomiting yesterday but that when she woke up today she felt better. Pt states that after eating lunch today her stomach began to hurt. Pt endorses that the pain is through her back on both sides and her upper abdomen. Pt went to urgent care today and had blood work done. Pt was given a prescription for zofran and cipro at urgent care but has not filled the prescription.

## 2020-06-02 ENCOUNTER — Encounter: Payer: Self-pay | Admitting: Family Medicine

## 2020-06-02 ENCOUNTER — Other Ambulatory Visit: Payer: Self-pay

## 2020-06-02 ENCOUNTER — Ambulatory Visit: Payer: BC Managed Care – PPO | Admitting: Family Medicine

## 2020-06-02 VITALS — BP 124/76 | HR 88 | Temp 97.9°F | Ht 63.0 in | Wt 208.5 lb

## 2020-06-02 DIAGNOSIS — K760 Fatty (change of) liver, not elsewhere classified: Secondary | ICD-10-CM

## 2020-06-02 DIAGNOSIS — Z23 Encounter for immunization: Secondary | ICD-10-CM

## 2020-06-02 DIAGNOSIS — R1013 Epigastric pain: Secondary | ICD-10-CM | POA: Diagnosis not present

## 2020-06-02 DIAGNOSIS — Z09 Encounter for follow-up examination after completed treatment for conditions other than malignant neoplasm: Secondary | ICD-10-CM | POA: Diagnosis not present

## 2020-06-02 MED ORDER — OMEPRAZOLE 20 MG PO CPDR: 20.0000 mg | DELAYED_RELEASE_CAPSULE | Freq: Every day | ORAL | 1 refills | Status: DC

## 2020-06-02 NOTE — Patient Instructions (Signed)
[employer]'s Current Therapy 2021 (pp. 213-216). [place], [place]: Elsevier.">  Gastroesophageal Reflux Disease, Adult Gastroesophageal reflux (GER) happens when acid from the stomach flows up into the tube that connects the mouth and the stomach (esophagus). Normally, food travels down the esophagus and stays in the stomach to be digested. However, when a person has GER, food and stomach acid sometimes move back up into the esophagus. If this becomes a more serious problem, the person may be diagnosed with a disease called gastroesophageal reflux disease (GERD). GERD occurs when the reflux:  Happens often.  Causes frequent or severe symptoms.  Causes problems such as damage to the esophagus. When stomach acid comes in contact with the esophagus, the acid may cause inflammation in the esophagus. Over time, GERD may create small holes (ulcers) in the lining of the esophagus. What are the causes? This condition is caused by a problem with the muscle between the esophagus and the stomach (lower esophageal sphincter, or LES). Normally, the LES muscle closes after food passes through the esophagus to the stomach. When the LES is weakened or abnormal, it does not close properly, and that allows food and stomach acid to go back up into the esophagus. The LES can be weakened by certain dietary substances, medicines, and medical conditions, including:  Tobacco use.  Pregnancy.  Having a hiatal hernia.  Alcohol use.  Certain foods and beverages, such as coffee, chocolate, onions, and peppermint. What increases the risk? You are more likely to develop this condition if you:  Have an increased body weight.  Have a connective tissue disorder.  Take NSAIDs, such as ibuprofen. What are the signs or symptoms? Symptoms of this condition include:  Heartburn.  Difficult or painful swallowing and the feeling of having a lump in the throat.  A bitter taste in the mouth.  Bad breath and having a large  amount of saliva.  Having an upset or bloated stomach and belching.  Chest pain. Different conditions can cause chest pain. Make sure you see your health care provider if you experience chest pain.  Shortness of breath or wheezing.  Ongoing (chronic) cough or a nighttime cough.  Wearing away of tooth enamel.  Weight loss. How is this diagnosed? This condition may be diagnosed based on a medical history and a physical exam. To determine if you have mild or severe GERD, your health care provider may also monitor how you respond to treatment. You may also have tests, including:  A test to examine your stomach and esophagus with a small camera (endoscopy).  A test that measures the acidity level in your esophagus.  A test that measures how much pressure is on your esophagus.  A barium swallow or modified barium swallow test to show the shape, size, and functioning of your esophagus. How is this treated? Treatment for this condition may vary depending on how severe your symptoms are. Your health care provider may recommend:  Changes to your diet.  Medicine.  Surgery. The goal of treatment is to help relieve your symptoms and to prevent complications. Follow these instructions at home: Eating and drinking  Follow a diet as recommended by your health care provider. This may involve avoiding foods and drinks such as: ? Coffee and tea, with or without caffeine. ? Drinks that contain alcohol. ? Energy drinks and sports drinks. ? Carbonated drinks or sodas. ? Chocolate and cocoa. ? Peppermint and mint flavorings. ? Garlic and onions. ? Horseradish. ? Spicy and acidic foods, including peppers, chili powder,   curry powder, vinegar, hot sauces, and barbecue sauce. ? Citrus fruit juices and citrus fruits, such as oranges, lemons, and limes. ? Tomato-based foods, such as red sauce, chili, salsa, and pizza with red sauce. ? Fried and fatty foods, such as donuts, french fries, potato  chips, and high-fat dressings. ? High-fat meats, such as hot dogs and fatty cuts of red and white meats, such as rib eye steak, sausage, ham, and bacon. ? High-fat dairy items, such as whole milk, butter, and cream cheese.  Eat small, frequent meals instead of large meals.  Avoid drinking large amounts of liquid with your meals.  Avoid eating meals during the 2-3 hours before bedtime.  Avoid lying down right after you eat.  Do not exercise right after you eat.   Lifestyle  Do not use any products that contain nicotine or tobacco. These products include cigarettes, chewing tobacco, and vaping devices, such as e-cigarettes. If you need help quitting, ask your health care provider.  Try to reduce your stress by using methods such as yoga or meditation. If you need help reducing stress, ask your health care provider.  If you are overweight, reduce your weight to an amount that is healthy for you. Ask your health care provider for guidance about a safe weight loss goal.   General instructions  Pay attention to any changes in your symptoms.  Take over-the-counter and prescription medicines only as told by your health care provider. Do not take aspirin, ibuprofen, or other NSAIDs unless your health care provider told you to take these medicines.  Wear loose-fitting clothing. Do not wear anything tight around your waist that causes pressure on your abdomen.  Raise (elevate) the head of your bed about 6 inches (15 cm). You can use a wedge to do this.  Avoid bending over if this makes your symptoms worse.  Keep all follow-up visits. This is important. Contact a health care provider if:  You have: ? New symptoms. ? Unexplained weight loss. ? Difficulty swallowing or it hurts to swallow. ? Wheezing or a persistent cough. ? A hoarse voice.  Your symptoms do not improve with treatment. Get help right away if:  You have sudden pain in your arms, neck, jaw, teeth, or back.  You  suddenly feel sweaty, dizzy, or light-headed.  You have chest pain or shortness of breath.  You vomit and the vomit is green, yellow, or black, or it looks like blood or coffee grounds.  You faint.  You have stool that is red, bloody, or black.  You cannot swallow, drink, or eat. These symptoms may represent a serious problem that is an emergency. Do not wait to see if the symptoms will go away. Get medical help right away. Call your local emergency services (911 in the [place] Do not drive yourself to the hospital. Summary  Gastroesophageal reflux happens when acid from the stomach flows up into the esophagus. GERD is a disease in which the reflux happens often, causes frequent or severe symptoms, or causes problems such as damage to the esophagus.  Treatment for this condition may vary depending on how severe your symptoms are. Your health care provider may recommend diet and lifestyle changes, medicine, or surgery.  Contact a health care provider if you have new or worsening symptoms.  Take over-the-counter and prescription medicines only as told by your health care provider. Do not take aspirin, ibuprofen, or other NSAIDs unless your health care provider told you to do so.  Keep all follow-up   visits as told by your health care provider. This is important. This information is not intended to replace advice given to you by your health care provider. Make sure you discuss any questions you have with your health care provider. Document Revised: 06/30/2019 Document Reviewed: 06/30/2019 Elsevier Patient Education  2021 Elsevier Inc.  

## 2020-06-02 NOTE — Progress Notes (Signed)
Established Patient Office Visit  Subjective:  Patient ID: Heather Arias, female    DOB: Aug 28, 1990  Age: 30 y.o. MRN: [id number]  CC:  Chief Complaint  Patient presents with  . Abdominal Pain    HPI Heather Arias presents for epigastric pain x 4 days. The pain starts in her epigastric region and radiates around to her back on both sides. She also had chills, nausea, and NBNB vomiting when this first started. She tried tums and pepto without improvement. The following day the had a gluten free chicken nugget and an orange. 30 mins later the pain returned. She went to UC after this and was given cipro for possible UTI and zofran for nausea. She was then seen in the ED after this. She was given a GI cocktail. This relieved her stomach pain but not her back pain. RUQ US showed fatty liver and no evidence of gallstones. CBC, CMP, UA, and lipase were all unremarkable. Negative urine pregnancy test. GERD was suspected as cause of pain. The stomach pain has continued after eating and lasts for 30 mins to 1 hour. The pain is a dull ache. The pain ranges from a 6-8/10. She has been eating a low fat diet. Overall she feels like her symptoms have improved. She is no longer having any nausea or vomiting. Reports decreased appetite. Denies fever or diarrhea, blood in stool, heartburn, or abdominal cramping. She has tried Pepcid once without relief. She would like to be referred to GI.   History reviewed. No pertinent past medical history.  Past Surgical History:  Procedure Laterality Date  . WISDOM TOOTH EXTRACTION  April 2017    Family History  Problem Relation Age of Onset  . Heart disease Maternal Grandmother     Social History   Socioeconomic History  . Marital status: Married    Spouse name: Not on file  . Number of children: Not on file  . Years of education: Not on file  . Highest education level: Not on file  Occupational History  . Not on file  Tobacco Use  . Smoking status:  Never Smoker  . Smokeless tobacco: Never Used  Substance and Sexual Activity  . Alcohol use: Yes  . Drug use: Not on file  . Sexual activity: Not on file  Other Topics Concern  . Not on file  Social History Narrative  . Not on file   Social Determinants of Health   Financial Resource Strain: Not on file  Food Insecurity: Not on file  Transportation Needs: Not on file  Physical Activity: Not on file  Stress: Not on file  Social Connections: Not on file  Intimate Partner Violence: Not on file    Outpatient Medications Prior to Visit  Medication Sig Dispense Refill  . ELDERBERRY PO     . ibuprofen (ADVIL,MOTRIN) 800 MG tablet Take 1 tablet (800 mg total) by mouth every 8 (eight) hours as needed. 30 tablet 0  . Lysine 1000 MG TABS Take by mouth.    . Prenatal Vit-Fe Fumarate-FA (PRENATAL VITAMINS PO)     . VITAMIN D PO     . BLISOVI 24 FE 1-20 MG-MCG(24) tablet     . phentermine (ADIPEX-P) 37.5 MG tablet Take 1 tablet (37.5 mg total) by mouth daily before breakfast. 30 tablet 2  . spironolactone (ALDACTONE) 25 MG tablet      No facility-administered medications prior to visit.    Allergies  Allergen Reactions  . Sulfa Antibiotics  ROS Review of Systems    Objective:    Physical Exam Vitals and nursing note reviewed.  Constitutional:      General: She is not in acute distress.    Appearance: She is not ill-appearing, toxic-appearing or diaphoretic.  Cardiovascular:     Rate and Rhythm: Normal rate and regular rhythm.     Heart sounds: Normal heart sounds. No murmur heard.   Pulmonary:     Effort: Pulmonary effort is normal. No respiratory distress.     Breath sounds: Normal breath sounds.  Abdominal:     General: Bowel sounds are normal. There is no distension or abdominal bruit. There are no signs of injury.     Palpations: Abdomen is soft. There is no shifting dullness, fluid wave, hepatomegaly, splenomegaly, mass or pulsatile mass.     Tenderness: There  is no abdominal tenderness. There is no right CVA tenderness, left CVA tenderness, guarding or rebound. Negative signs include Murphy's sign and McBurney's sign.     Hernia: No hernia is present.  Skin:    General: Skin is warm and dry.  Neurological:     General: No focal deficit present.     Mental Status: She is alert and oriented to person, place, and time.  Psychiatric:        Mood and Affect: Mood normal.        Behavior: Behavior normal.     BP 124/76   Pulse 88   Temp 97.9 F (36.6 C) (Temporal)   Ht 5\' 3"  (1.6 m)   Wt 208 lb 8 oz (94.6 kg)   BMI 36.93 kg/m  Wt Readings from Last 3 Encounters:  06/02/20 208 lb 8 oz (94.6 kg)  05/31/20 209 lb 7 oz (95 kg)  09/16/18 212 lb (96.2 kg)     Health Maintenance Due  Topic Date Due  . COVID-19 Vaccine (1) Never done  . HIV Screening  Never done  . PAP-Cervical Cytology Screening  Never done  . PAP SMEAR-Modifier  Never done  . TETANUS/TDAP  04/06/2020    There are no preventive care reminders to display for this patient.  Lab Results  Component Value Date   TSH 2.780 12/22/2014   Lab Results  Component Value Date   WBC 7.5 05/31/2020   HGB 13.5 05/31/2020   HCT 40.4 05/31/2020   MCV 97.1 05/31/2020   PLT 248 05/31/2020   Lab Results  Component Value Date   NA 140 05/31/2020   K 4.1 05/31/2020   CO2 25 05/31/2020   GLUCOSE 100 (H) 05/31/2020   BUN 8 05/31/2020   CREATININE 0.69 05/31/2020   BILITOT 0.3 05/31/2020   ALKPHOS 82 05/31/2020   AST 21 05/31/2020   ALT 27 05/31/2020   PROT 7.4 05/31/2020   ALBUMIN 4.4 05/31/2020   CALCIUM 9.7 05/31/2020   ANIONGAP 8 05/31/2020   No results found for: CHOL No results found for: HDL No results found for: LDLCALC No results found for: TRIG No results found for: CHOLHDL No results found for: 06/02/2020    Assessment & Plan:   Heather Arias was seen today for abdominal pain.  Diagnoses and all orders for this visit:  Fatty liver Referral to GI. Discussed low  fat diet.  -     Ambulatory referral to Gastroenterology  Epigastric pain Start prilosec daily. May take pepcid BID prn. Continue low fat diet. Avoid spicy foods. Eat small meals. Referral to GI. Handout given.   -  Ambulatory referral to Gastroenterology -     omeprazole (PRILOSEC) 20 MG capsule; Take 1 capsule (20 mg total) by mouth daily.  Need for vaccination Tdap today in office.        -     Tdap vaccine greater than or equal to 7yo IM  Hospital discharge follow-up Reviewed ED record.    Follow-up: Return in about 4 weeks (around 06/30/2020) for abdominal pain. Sooner for new or worsening symptoms.   The patient indicates understanding of these issues and agrees with the plan.   Gabriel Earing, FNP

## 2020-06-03 ENCOUNTER — Other Ambulatory Visit: Payer: Self-pay | Admitting: Family Medicine

## 2020-06-07 ENCOUNTER — Encounter: Payer: Self-pay | Admitting: Physician Assistant

## 2020-06-10 ENCOUNTER — Ambulatory Visit: Payer: BC Managed Care – PPO | Admitting: Physician Assistant

## 2020-06-10 ENCOUNTER — Encounter: Payer: Self-pay | Admitting: Physician Assistant

## 2020-06-10 VITALS — BP 130/80 | Ht 63.0 in | Wt 208.0 lb

## 2020-06-10 DIAGNOSIS — R194 Change in bowel habit: Secondary | ICD-10-CM | POA: Diagnosis not present

## 2020-06-10 DIAGNOSIS — R1013 Epigastric pain: Secondary | ICD-10-CM

## 2020-06-10 DIAGNOSIS — R9389 Abnormal findings on diagnostic imaging of other specified body structures: Secondary | ICD-10-CM

## 2020-06-10 MED ORDER — OMEPRAZOLE 40 MG PO CPDR: 40.0000 mg | DELAYED_RELEASE_CAPSULE | Freq: Two times a day (BID) | ORAL | 3 refills | Status: DC

## 2020-06-10 NOTE — Progress Notes (Signed)
Chief Complaint: Epigastric pain, fatty liver  HPI:    Heather Arias is a 30 year old female with a past medical history as listed below including PCOS, who was referred to me by Junie Spencer, FNP for a complaint of epigastric pain and fatty liver.      05/31/2020 patient seen in the [facility], ER for abdominal pain.  Described bilateral upper abdominal pain and epigastric pain radiating to her chest that is started the day before after eating.  It had improved but then returned again that morning when she tried to eat.  Associated symptoms include nausea and nonbloody nonbilious vomiting.  Labs with a CMP and elevated glucose of 100, urinalysis CBC lipase and hCG negative/normal.  Right upper quadrant ultrasound with diffuse increased liver echogenicity most commonly seen in the setting of fatty infiltration as well as a 3.4 x 2.4 x 2.5 cm hypoechoic area in the right lobe with no increased internal vascularity which was not characterized but may represent a focal fat sparing.  Follow-up ultrasound in 3 months or further evaluation with MRI was recommended.    Today, the patient presents to clinic and explains that on Sunday, 06/06/2020 around 4 PM she started with an epigastric pain which radiated through to her back, later that evening developed chills and stayed in a 10/10 pain until she finally fell asleep around 1 or 2:00 in the morning.  She woke up with just a "sore" abdomen but the pain was much better so she went to work but really did not eat much for breakfast.  She then ate lunch and the pain started again so intense that she proceeded to the urgent care.  There she was told she had a UTI (we do not have records of this) and was given antibiotics, though "I was not having any UTI symptoms".  She then decided to go to the ER given that this diagnosis really did not make sense.  Work-up as above and they told her she did not have a UTI.  Explains that she continued to be in pain there with an  elevated blood pressure etc. and they really just "rushed me out".      Snce being seen she has continued with an epigastric soreness rated as a constant 5/10 which increases if she eats anything.  Tells me that she is taking an over-the-counter antacid which she thinks is Famotidine as needed, but when she uses this it really does not help so she does not use it often.  Along with this has been radiating back-and-forth from constipation, having a hard time to pass solid stools, to diarrhea with a stool every hour.  She is typically on a gluten-free diet given her PCOS and has also started to try and avoid fatty meals as well.  Denies any recent change in medications or change in diet.    Does describe history of a similar pain she remembers having 6 years ago in which her grandmother thought it was just trapped gas and she went to her house and took an Omeprazole and walked around and it seemed to go away.  Tells me her entire family has reflux.    Also tells me that she has history of elevated liver enzymes, though these "went back to normal", and she was followed no further.  She has never had an ultrasound before now.    Denies fever, weight loss or blood in her stool.  Past medical history: PCOS  Past Surgical History:  Procedure Laterality Date   WISDOM TOOTH EXTRACTION  April 2017    Current Outpatient Medications  Medication Sig Dispense Refill   ELDERBERRY PO      ibuprofen (ADVIL,MOTRIN) 800 MG tablet Take 1 tablet (800 mg total) by mouth every 8 (eight) hours as needed. 30 tablet 0   Lysine 1000 MG TABS Take by mouth.     omeprazole (PRILOSEC) 20 MG capsule Take 1 capsule (20 mg total) by mouth daily. 30 capsule 1   Prenatal Vit-Fe Fumarate-FA (PRENATAL VITAMINS PO)      VITAMIN D PO      No current facility-administered medications for this visit.    Allergies as of 06/10/2020 - Review Complete 06/10/2020  Allergen Reaction Noted   Sulfa antibiotics  07/10/2013    Family  History  Problem Relation Age of Onset   Heart disease Maternal Grandmother     Social History   Socioeconomic History   Marital status: Married    Spouse name: Not on file   Number of children: Not on file   Years of education: Not on file   Highest education level: Not on file  Occupational History   Not on file  Tobacco Use   Smoking status: Never   Smokeless tobacco: Never  Substance and Sexual Activity   Alcohol use: Yes   Drug use: Not on file   Sexual activity: Not on file  Other Topics Concern   Not on file  Social History Narrative   Not on file   Social Determinants of Health   Financial Resource Strain: Not on file  Food Insecurity: Not on file  Transportation Needs: Not on file  Physical Activity: Not on file  Stress: Not on file  Social Connections: Not on file  Intimate Partner Violence: Not on file    Review of Systems:    Constitutional: No weight loss or fever Skin: No rash Cardiovascular: No chest pain  Respiratory: No SOB Gastrointestinal: See HPI and otherwise negative Genitourinary: No dysuria  Neurological: No headache, dizziness or syncope Musculoskeletal: No new muscle or joint pain Hematologic: No bleeding  Psychiatric: No history of depression or anxiety   Physical Exam:  Vital signs: BP 130/80   Ht 5\' 3"  (1.6 m)   Wt 208 lb (94.3 kg)   BMI 36.85 kg/m    Constitutional:   Pleasant overweight Caucasian female appears to be in NAD, Well developed, Well nourished, alert and cooperative Head:  Normocephalic and atraumatic. Eyes:   PEERL, EOMI. No icterus. Conjunctiva pink. Ears:  Normal auditory acuity. Neck:  Supple Throat: Oral cavity and pharynx without inflammation, swelling or lesion.  Respiratory: Respirations even and unlabored. Lungs clear to auscultation bilaterally.   No wheezes, crackles, or rhonchi.  Cardiovascular: Normal S1, S2. No MRG. Regular rate and rhythm. No peripheral edema, cyanosis or pallor.   Gastrointestinal:  Soft, nondistended, mild epigastric ttp. No rebound or guarding. Normal bowel sounds. No appreciable masses or hepatomegaly. Rectal:  Not performed.  Msk:  Symmetrical without gross deformities. Without edema, no deformity or joint abnormality.  Neurologic:  Alert and  oriented x4;  grossly normal neurologically.  Skin:   Dry and intact without significant lesions or rashes. Psychiatric: Demonstrates good judgement and reason without abnormal affect or behaviors.  RELEVANT LABS AND IMAGING: CBC    Component Value Date/Time   WBC 7.5 05/31/2020 1559   RBC 4.16 05/31/2020 1559   HGB 13.5 05/31/2020 1559   HGB 13.9 09/16/2018 1048   HCT  40.4 05/31/2020 1559   HCT 41.4 09/16/2018 1048   PLT 248 05/31/2020 1559   PLT 238 09/16/2018 1048   MCV 97.1 05/31/2020 1559   MCV 98 (H) 09/16/2018 1048   MCH 32.5 05/31/2020 1559   MCHC 33.4 05/31/2020 1559   RDW 12.3 05/31/2020 1559   RDW 12.0 09/16/2018 1048   LYMPHSABS 2.3 05/31/2020 1559   LYMPHSABS 2.1 09/16/2018 1048   MONOABS 0.5 05/31/2020 1559   EOSABS 0.1 05/31/2020 1559   EOSABS 0.2 09/16/2018 1048   BASOSABS 0.0 05/31/2020 1559   BASOSABS 0.0 09/16/2018 1048    CMP     Component Value Date/Time   NA 140 05/31/2020 1559   NA 140 09/16/2018 1048   K 4.1 05/31/2020 1559   CL 107 05/31/2020 1559   CO2 25 05/31/2020 1559   GLUCOSE 100 (H) 05/31/2020 1559   BUN 8 05/31/2020 1559   BUN 11 09/16/2018 1048   CREATININE 0.69 05/31/2020 1559   CALCIUM 9.7 05/31/2020 1559   PROT 7.4 05/31/2020 1559   PROT 6.8 09/16/2018 1048   ALBUMIN 4.4 05/31/2020 1559   ALBUMIN 4.4 09/16/2018 1048   AST 21 05/31/2020 1559   ALT 27 05/31/2020 1559   ALKPHOS 82 05/31/2020 1559   BILITOT 0.3 05/31/2020 1559   BILITOT 0.2 09/16/2018 1048   GFRNONAA >60 05/31/2020 1559   GFRAA 136 09/16/2018 1048    Assessment: 1.  Abnormal ultrasound of the abdomen: Showing fatty liver and a likely area of fatty sparing on the liver  versus cyst, normal LFTs 2.  Epigastric pain: Started on 06/06/2020, ER work-up with right upper quadrant ultrasound showing fatty liver and otherwise normal, labs normal, worse after eating; most consistent with gastritis 3.  Change in bowel habits: Radiating from diarrhea to constipation; consider relation to gastritis versus IBS versus other  Plan: 1.  Discussed with patient that most likely this pain represents gastritis.  We will start her on high-dose PPI, Omeprazole 40 mg twice daily, 30-60 minutes before breakfast and dinner for the next 1 to 2 months.  Prescribed #60 with 3 refills. 2.  Reviewed antireflux diet. 3.  Discussed fatty liver seen at time of ultrasound.  Her LFTs are normal at this point which means that this is mild.  Encouraged her to maintain her healthy diet and even try to lose 1 to 2 pounds a week. 4.  Discussed abnormality seen on imaging in the liver.  Likely this is completely benign, but will need follow-up ultrasound in 3 months.  This was ordered today. 5.  Patient to follow in clinic with me in 1 month.  Told her to call and let us know if she is not doing any better over the next week.  At that point would recommend an EGD for further evaluation.  Assigned to Dr. Barron Alvine today.  Hyacinth Meeker, PA-C [facility] 06/10/2020, 1:32 PM  Cc: Junie Spencer, FNP

## 2020-06-10 NOTE — Patient Instructions (Addendum)
We have sent the following medications to your pharmacy for you to pick up at your convenience: Omeprazole 40 mg twice daily 30-60 minutes before breakfast and dinner.   Follow up ultrasound in August.   Follow up in 1 month.   If you are age 29 or older, your body mass index should be between 23-30. Your Body mass index is 36.85 kg/m. If this is out of the aforementioned range listed, please consider follow up with your Primary Care Provider.  If you are age 29 or younger, your body mass index should be between 19-25. Your Body mass index is 36.85 kg/m. If this is out of the aformentioned range listed, please consider follow up with your Primary Care Provider.   __________________________________________________________  The [facility] providers would like to encourage you to use [facility] to communicate with providers for non-urgent requests or questions.  Due to long hold times on the telephone, sending your provider a message by [facility] may be a faster and more efficient way to get a response.  Please allow 48 business hours for a response.  Please remember that this is for non-urgent requests.

## 2020-06-11 NOTE — Progress Notes (Signed)
Agree with the assessment and plan as outlined by Jennifer Lemmon, PA-C. ? ?Suraj Ramdass, DO, FACG ? ?

## 2020-07-08 ENCOUNTER — Ambulatory Visit: Payer: BC Managed Care – PPO | Admitting: Physician Assistant

## 2020-07-12 ENCOUNTER — Ambulatory Visit: Payer: Self-pay | Admitting: Physician Assistant

## 2021-03-21 DIAGNOSIS — N939 Abnormal uterine and vaginal bleeding, unspecified: Secondary | ICD-10-CM | POA: Diagnosis not present

## 2021-03-21 DIAGNOSIS — N926 Irregular menstruation, unspecified: Secondary | ICD-10-CM | POA: Diagnosis not present

## 2021-12-20 ENCOUNTER — Other Ambulatory Visit: Payer: Self-pay | Admitting: Physician Assistant

## 2021-12-20 ENCOUNTER — Ambulatory Visit (INDEPENDENT_AMBULATORY_CARE_PROVIDER_SITE_OTHER): Payer: 59 | Admitting: Nurse Practitioner

## 2021-12-20 ENCOUNTER — Encounter: Payer: Self-pay | Admitting: Nurse Practitioner

## 2021-12-20 VITALS — BP 124/84 | HR 75 | Temp 97.6°F | Resp 20 | Ht 63.0 in | Wt 199.0 lb

## 2021-12-20 DIAGNOSIS — R051 Acute cough: Secondary | ICD-10-CM

## 2021-12-20 DIAGNOSIS — L2084 Intrinsic (allergic) eczema: Secondary | ICD-10-CM

## 2021-12-20 MED ORDER — TRIAMCINOLONE ACETONIDE 0.1 % EX CREA: 1.0000 | TOPICAL_CREAM | Freq: Two times a day (BID) | CUTANEOUS | 0 refills | Status: AC

## 2021-12-20 MED ORDER — PREDNISONE 20 MG PO TABS: 40.0000 mg | ORAL_TABLET | Freq: Every day | ORAL | 0 refills | Status: AC

## 2021-12-20 NOTE — Progress Notes (Signed)
Subjective:    Patient ID: Heather Arias, female    DOB: 1990/11/04, 31 y.o.   MRN: [id number]   Chief Complaint: Rash on left hand and Cough   Cough Pertinent negatives include no chest pain, headaches, rash or shortness of breath.   Patient comes in c/o: - rash on left hand- started  the beginning of NOvember. Dry and itchy. OTC eczema cream and eucerin. - cough- intermittent since July- is allergic to her dogs   Review of Systems  Constitutional:  Negative for diaphoresis.  Eyes:  Negative for pain.  Respiratory:  Positive for cough. Negative for shortness of breath.   Cardiovascular:  Negative for chest pain, palpitations and leg swelling.  Gastrointestinal:  Negative for abdominal pain.  Endocrine: Negative for polydipsia.  Skin:  Negative for rash.  Neurological:  Negative for dizziness, weakness and headaches.  Hematological:  Does not bruise/bleed easily.  All other systems reviewed and are negative.      Objective:   Physical Exam Constitutional:      Appearance: Normal appearance.  Cardiovascular:     Rate and Rhythm: Normal rate and regular rhythm.     Heart sounds: Normal heart sounds.  Pulmonary:     Effort: Pulmonary effort is normal.  Skin:    General: Skin is warm.     Comments: Left thumb is erythematous and scaley  Neurological:     General: No focal deficit present.     Mental Status: She is alert and oriented to person, place, and time.  Psychiatric:        Mood and Affect: Mood normal.        Behavior: Behavior normal.     BP 124/84   Pulse 75   Temp 97.6 F (36.4 C) (Temporal)   Resp 20   Ht 5\' 3"  (1.6 m)   Wt 199 lb (90.3 kg)   SpO2 97%   BMI 35.25 kg/m        Assessment & Plan:   in today with chief complaint of Rash on left hand and Cough   1. Intrinsic atopic dermatitis Claritin daily moisturize - triamcinolone cream (KENALOG) 0.1 %; Apply 1 Application topically 2 (two) times daily.  Dispense: 453.6  g; Refill: 0  2. Acute cough 1. Take meds as prescribed 2. Use a cool mist humidifier especially during the winter months and when heat has been humid. 3. Use saline nose sprays frequently 4. Saline irrigations of the nose can be very helpful if done frequently.  * 4X daily for 1 week*  * Use of a nettie pot can be helpful with this. Follow directions with this* 5. Drink plenty of fluids 6. Keep thermostat turn down low 7.For any cough or congestion- mucinex 8. For fever or aces or pains- take tylenol or ibuprofen appropriate for age and weight.  * for fevers greater than 101 orally you may alternate ibuprofen and tylenol every  3 hours.    - predniSONE (DELTASONE) 20 MG tablet; Take 2 tablets (40 mg total) by mouth daily with breakfast for 5 days. 2 po daily for 5 days  Dispense: 10 tablet; Refill: 0    The above assessment and management plan was discussed with the patient. The patient verbalized understanding of and has agreed to the management plan. Patient is aware to call the clinic if symptoms persist or worsen. Patient is aware when to return to the clinic for a follow-up visit. Patient educated on when it  is appropriate to go to the emergency department.   Mary-Margaret Hassell Done, FNP

## 2021-12-20 NOTE — Patient Instructions (Signed)
Atopic Dermatitis ?Atopic dermatitis is a skin disorder that causes inflammation of the skin. It is marked by a red rash and itchy, dry, scaly skin. It is the most common type of eczema. Eczema is a group of skin conditions that cause the skin to become rough and swollen. This condition is generally worse during the cooler winter months and often improves during the warm summer months. ?Atopic dermatitis usually starts showing signs in infancy and can last through adulthood. This condition cannot be passed from one person to another (is not contagious). Atopic dermatitis may not always be present, but when it is, it is called a flare-up. ?What are the causes? ?The exact cause of this condition is not known. Flare-ups may be triggered by: ?Coming in contact with something that you are sensitive or allergic to (allergen). ?Stress. ?Certain foods. ?Extremely hot or cold weather. ?Harsh chemicals and soaps. ?Dry air. ?Chlorine. ?What increases the risk? ?This condition is more likely to develop in people who have a personal or family history of: ?Eczema. ?Allergies. ?Asthma. ?Hay fever. ?What are the signs or symptoms? ?Symptoms of this condition include: ?Dry, scaly skin. ?Red, itchy rash. ?Itchiness, which can be severe. This may occur before the skin rash. This can make sleeping difficult. ?Skin thickening and cracking that can occur over time. ?How is this diagnosed? ?This condition is diagnosed based on: ?Your symptoms. ?Your medical history. ?A physical exam. ?How is this treated? ?There is no cure for this condition, but symptoms can usually be controlled. Treatment focuses on: ?Controlling the itchiness and scratching. You may be given medicines, such as antihistamines or steroid creams. ?Limiting exposure to allergens. ?Recognizing situations that cause stress and developing a plan to manage stress. ?If your atopic dermatitis does not get better with medicines, or if it is all over your body (widespread), a  treatment using a specific type of light (phototherapy) may be used. ?Follow these instructions at home: ?Skin care ? ?Keep your skin well moisturized. Doing this seals in moisture and helps to prevent dryness. ?Use unscented lotions that have petroleum in them. ?Avoid lotions that contain alcohol or water. They can dry the skin. ?Keep baths or showers short (less than 5 minutes) in warm water. Do not use hot water. ?Use mild, unscented cleansers for bathing. Avoid soap and bubble bath. ?Apply a moisturizer to your skin right after a bath or shower. ?Do not apply anything to your skin without checking with your health care provider. ?General instructions ?Take or apply over-the-counter and prescription medicines only as told by your health care provider. ?Dress in clothes made of cotton or cotton blends. Dress lightly because heat increases itchiness. ?When washing your clothes, rinse your clothes twice so all of the soap is removed. ?Avoid any triggers that can cause a flare-up. ?Keep your fingernails cut short. ?Avoid scratching. Scratching makes the rash and itchiness worse. A break in the skin from scratching could result in a skin infection (impetigo). ?Do not be around people who have cold sores or fever blisters. If you get the infection, it may cause your atopic dermatitis to worsen. ?Keep all follow-up visits. This is important. ?Contact a health care provider if: ?Your itchiness interferes with sleep. ?Your rash gets worse or is not better within one week of starting treatment. ?You have a fever. ?You have a rash flare-up after having contact with someone who has cold sores or fever blisters. ?Get help right away if: ?You develop pus or soft yellow scabs in the rash   area. ?Summary ?Atopic dermatitis causes a red rash and itchy, dry, scaly skin. ?Treatment focuses on controlling the itchiness and scratching, limiting exposure to things that you are sensitive or allergic to (allergens), recognizing  situations that cause stress, and developing a plan to manage stress. ?Keep your skin well moisturized. ?Keep baths or showers shorter than 5 minutes and use warm water. Do not use hot water. ?This information is not intended to replace advice given to you by your health care provider. Make sure you discuss any questions you have with your health care provider. ?Document Revised: 09/29/2019 Document Reviewed: 09/29/2019 ?Elsevier Patient Education ? 2023 [employer] ? ?

## 2022-03-26 ENCOUNTER — Other Ambulatory Visit: Payer: Self-pay | Admitting: Physician Assistant

## 2022-05-15 IMAGING — US US ABDOMEN LIMITED
1 series · 14 of 25 positions shown · non-contrast
Comparison: None.

CLINICAL DATA: 29-year-old female with abdominal pain.

EXAM:
ULTRASOUND ABDOMEN LIMITED RIGHT UPPER QUADRANT

[Series 1: us abdomen limited · 51 acquisitions, 14 frames shown]
[im 1/51]
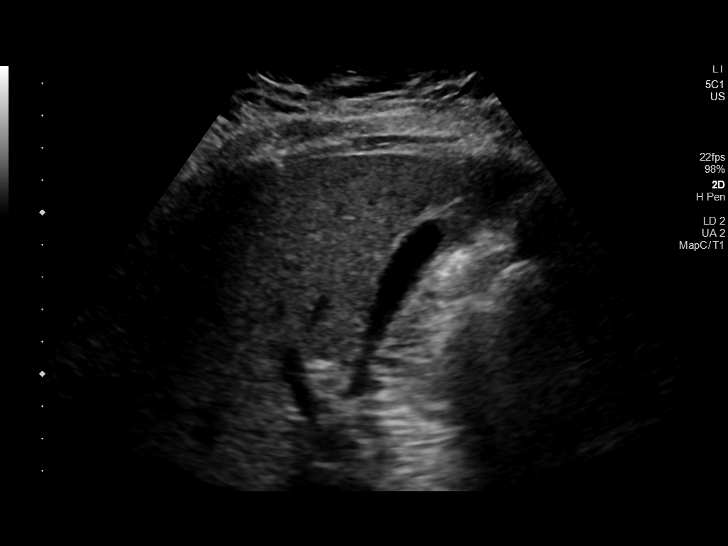
[im 5/51]
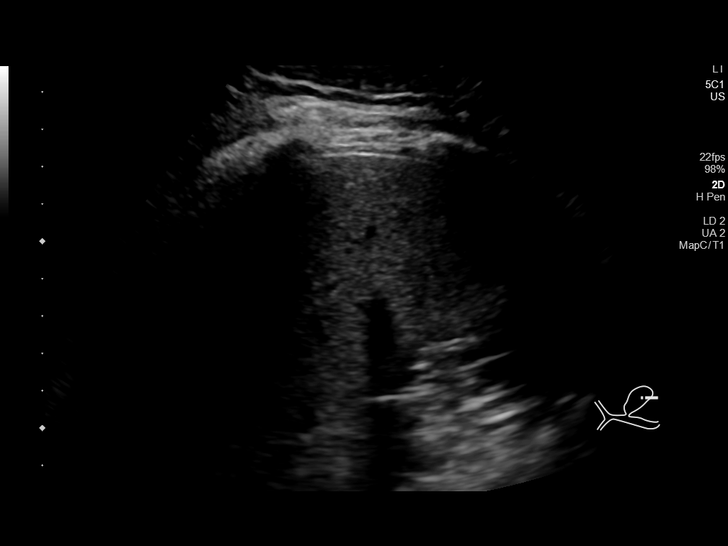
[im 9/51]
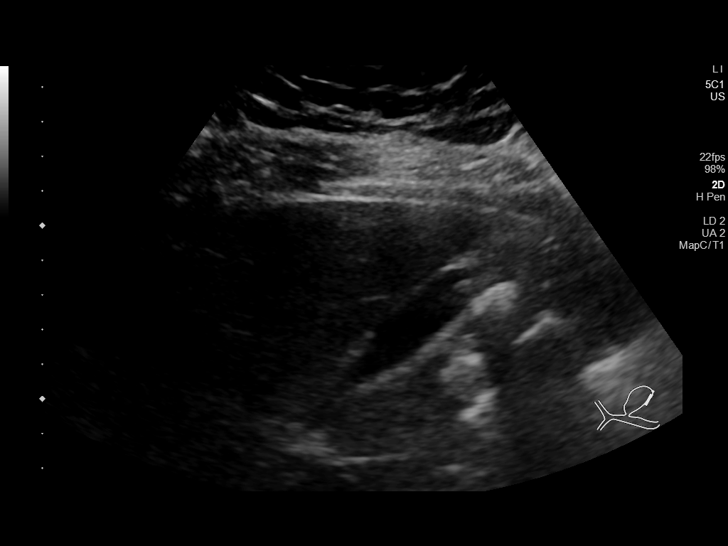
[im 13/51]
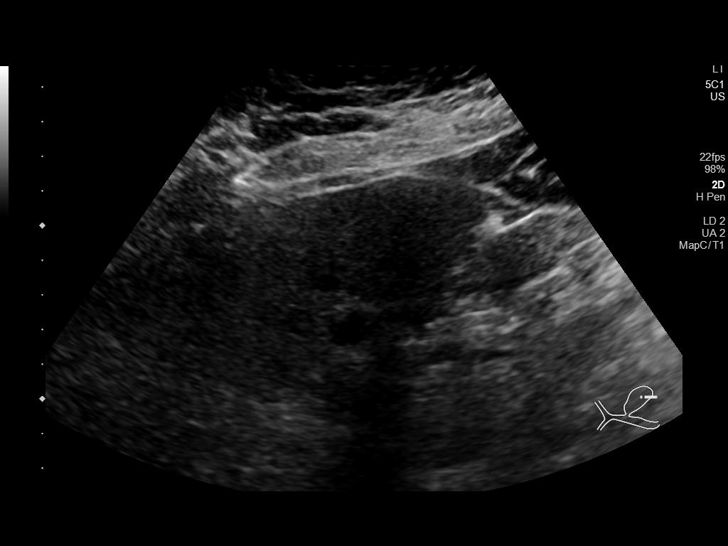
[im 17/51]
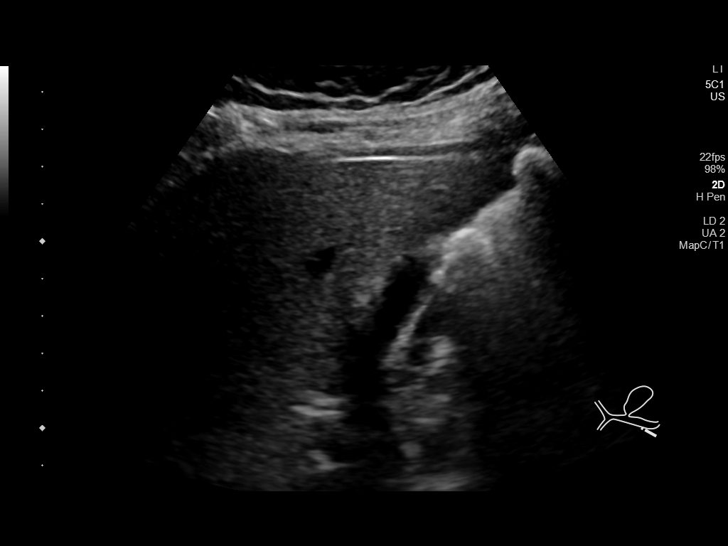
[im 19/51]
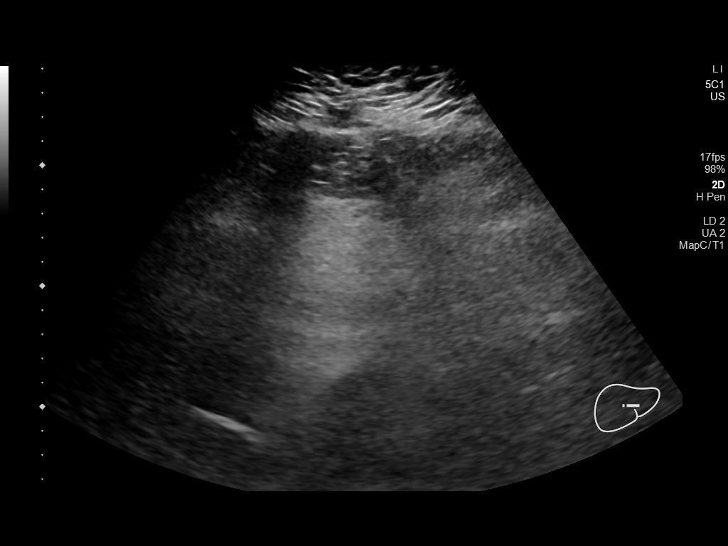
[im 23/51]
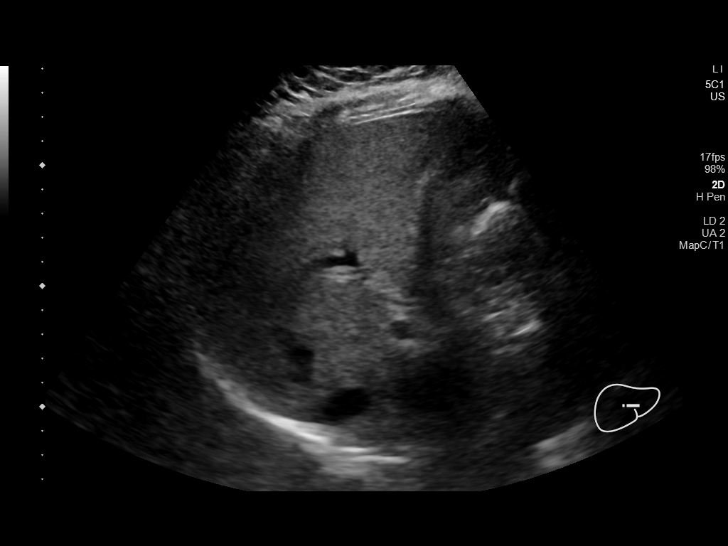
[im 28/51]
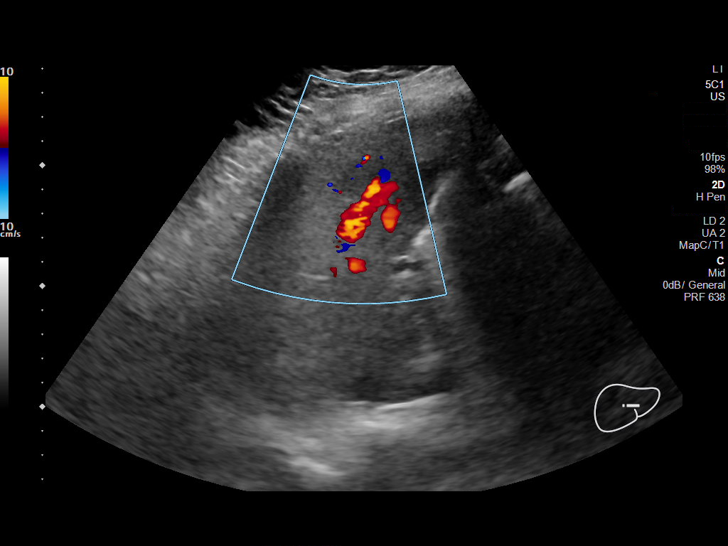
[im 32/51]
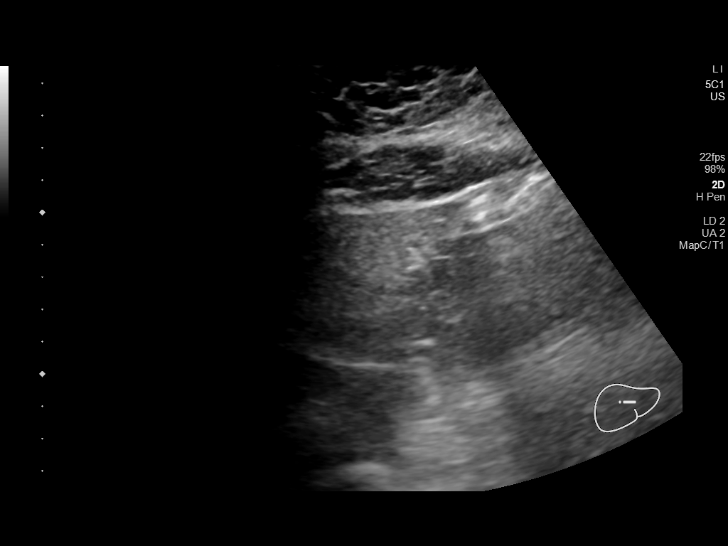
[im 34/51]
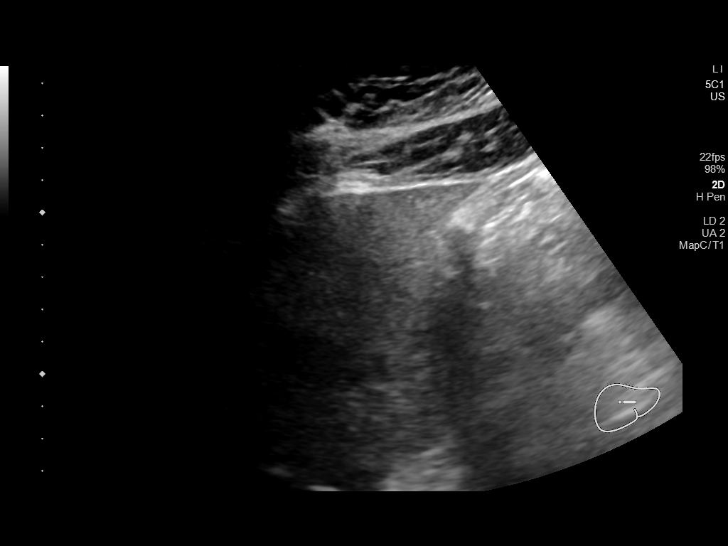
[im 38/51]
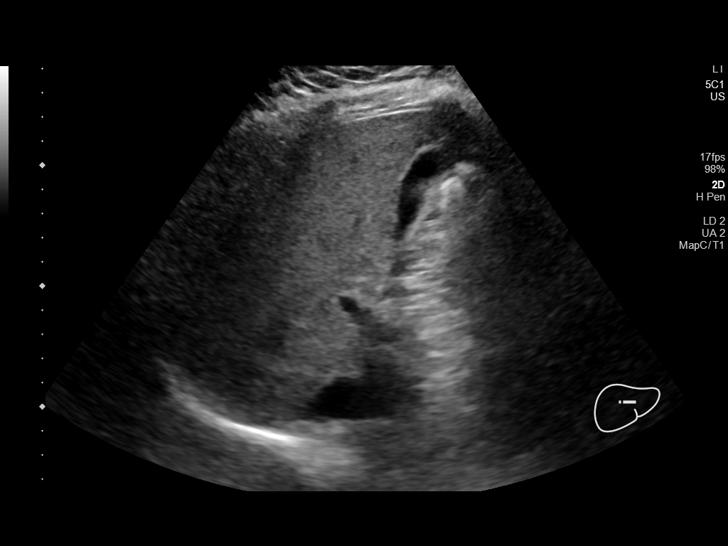
[im 42/51]
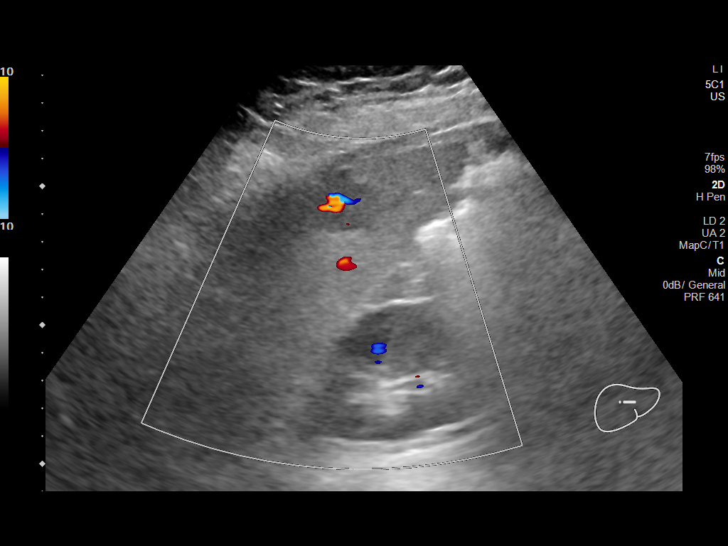
[im 46/51]
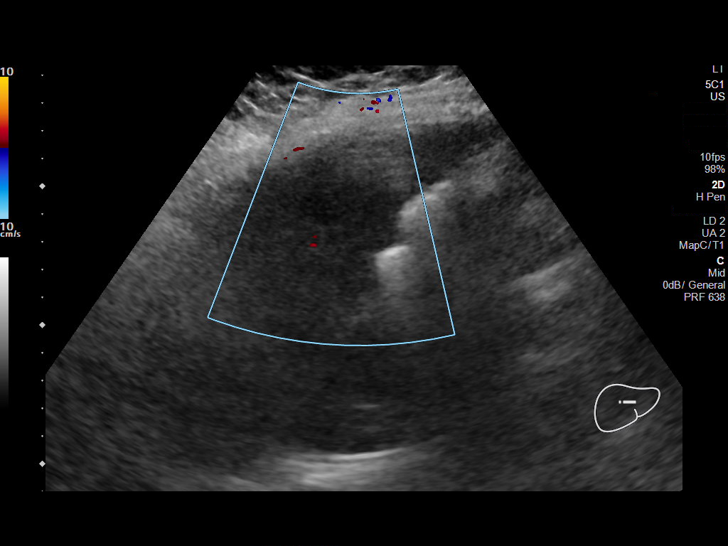
[im 51/51]
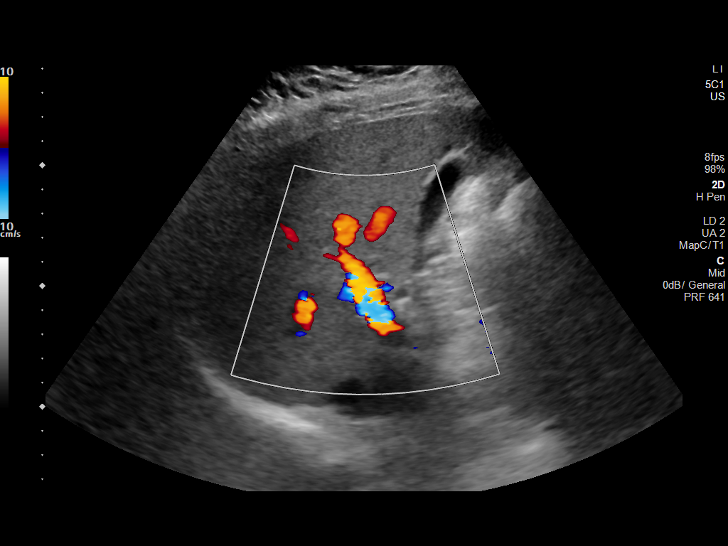

[14 of 25 positions shown; findings below may reference images not displayed]

FINDINGS: Gallbladder:

No gallstones or wall thickening visualized. No sonographic Murphy
sign noted by sonographer.

Common bile duct:

Diameter: 3 mm

Liver:

There is diffuse increased liver echogenicity most commonly seen in
the setting of fatty infiltration. There is a 3.4 x 2.4 x 2.5 cm
hypoechoic area in the right lobe with no increased internal
vascularity which is not characterized but may represent a focal fat
sparing. Follow-up with ultrasound in 3 months or further evaluation
with MRI is recommended. Portal vein is patent on color Doppler
imaging with normal direction of blood flow towards the liver.

Other: None.
IMPRESSION: 1. No gallstone.
2. Fatty liver with probable focal area of fat sparing. Follow-up
with ultrasound or further evaluation with MRI on a
nonemergent/outpatient basis is recommended.

## 2023-06-27 ENCOUNTER — Emergency Department (HOSPITAL_COMMUNITY)
Admission: EM | Admit: 2023-06-27 | Discharge: 2023-06-27 | Disposition: A | Discharge status: Home / Self Care | Payer: Worker's Compensation | Attending: Emergency Medicine | Admitting: Emergency Medicine

## 2023-06-27 ENCOUNTER — Encounter (HOSPITAL_COMMUNITY): Payer: Self-pay

## 2023-06-27 ENCOUNTER — Other Ambulatory Visit: Payer: Self-pay

## 2023-06-27 DIAGNOSIS — T7840XA Allergy, unspecified, initial encounter: Secondary | ICD-10-CM | POA: Insufficient documentation

## 2023-06-27 LAB — BASIC METABOLIC PANEL WITH GFR
Anion gap: 8 (ref 5–15)
BUN: 7 mg/dL (ref 6–20)
CO2: 22 mmol/L (ref 22–32)
Calcium: 9.2 mg/dL (ref 8.9–10.3)
Chloride: 109 mmol/L (ref 98–111)
Creatinine, Ser: 0.88 mg/dL (ref 0.44–1.00)
GFR, Estimated: >60 mL/min (ref >60)
Glucose, Bld: 86 mg/dL (ref 70–99)
Potassium: 3.9 mmol/L (ref 3.5–5.1)
Sodium: 139 mmol/L (ref 135–145)

## 2023-06-27 LAB — CBC WITH DIFFERENTIAL/PLATELET
Abs Immature Granulocytes: 0.03 K/uL (ref 0.00–0.07)
Basophils Absolute: 0 K/uL (ref 0.0–0.1)
Basophils Relative: 0 %
Eosinophils Absolute: 0.1 K/uL (ref 0.0–0.5)
Eosinophils Relative: 1 %
HCT: 41.3 % (ref 36.0–46.0)
Hemoglobin: 13.8 g/dL (ref 12.0–15.0)
Immature Granulocytes: 0 %
Lymphocytes Relative: 17 %
Lymphs Abs: 1.8 K/uL (ref 0.7–4.0)
MCH: 32.7 pg (ref 26.0–34.0)
MCHC: 33.4 g/dL (ref 30.0–36.0)
MCV: 97.9 fL (ref 80.0–100.0)
Monocytes Absolute: 0.6 K/uL (ref 0.1–1.0)
Monocytes Relative: 6 %
Neutro Abs: 8 K/uL — ABNORMAL HIGH (ref 1.7–7.7)
Neutrophils Relative %: 76 %
Platelets: 228 K/uL (ref 150–400)
RBC: 4.22 MIL/uL (ref 3.87–5.11)
RDW: 12.3 % (ref 11.5–15.5)
WBC: 10.5 K/uL (ref 4.0–10.5)
nRBC: 0 % (ref 0.0–0.2)

## 2023-06-27 LAB — HCG, SERUM, QUALITATIVE: Preg, Serum: NEGATIVE

## 2023-06-27 MED ORDER — PREDNISONE 10 MG PO TABS: 20.0000 mg | ORAL_TABLET | Freq: Every day | ORAL | 0 refills | Status: AC

## 2023-06-27 MED ORDER — EPINEPHRINE 0.3 MG/0.3ML IJ SOAJ: 0.3000 mg | INTRAMUSCULAR | 0 refills | Status: AC | PRN

## 2023-06-27 MED ORDER — SODIUM CHLORIDE 0.9 % IV BOLUS
1000.0000 mL | Freq: Once | INTRAVENOUS | Status: AC
  Administered 2023-06-27: 1000 mL via INTRAVENOUS

## 2023-06-27 MED ORDER — METHYLPREDNISOLONE SODIUM SUCC 125 MG IJ SOLR
125.0000 mg | Freq: Once | INTRAMUSCULAR | Status: AC
  Administered 2023-06-27: 125 mg via INTRAVENOUS
  Filled 2023-06-27: qty 2

## 2023-06-27 MED ORDER — FAMOTIDINE IN NACL 20-0.9 MG/50ML-% IV SOLN
20.0000 mg | Freq: Once | INTRAVENOUS | Status: AC
  Administered 2023-06-27: 20 mg via INTRAVENOUS
  Filled 2023-06-27: qty 50

## 2023-06-27 NOTE — ED Provider Notes (Signed)
 [place] EMERGENCY DEPARTMENT AT [facility] Provider Note   CSN: [id number] Arrival date & time: 06/27/23  1011     Patient presents with: Insect Bite   Heather Arias is a 33 y.o. female.   HPI   33 year old female presents emergency department with what appears to be an allergic reaction.  Patient was bit by something on her right lateral thigh while she was outside working around the barn.  She states that she immediately had redness and a local reaction which progressed to body rash, shortness of breath/wheezing, itchiness in the throat.  She took 2 doses of Benadryl prior to arrival.  No history of anaphylactic reactions, does not use EpiPen.  After Benadryl the breathing and throat feels improved but she still has some scratchiness in her throat along with body rash and itching.  Prior to Admission medications   Medication Sig Start Date End Date Taking? Authorizing Provider  ELDERBERRY PO     [provider]  ibuprofen  (ADVIL ,MOTRIN ) 800 MG tablet Take 1 tablet (800 mg total) by mouth every 8 (eight) hours as needed. 01/07/14   Pennie Elsie JINNY, FNP  Lysine 1000 MG TABS Take by mouth.    [provider]  omeprazole  (PRILOSEC) 40 MG capsule TAKE 1 CAPSULE BY MOUTH TWICE A DAY 12/20/21   Beather Delon Gibson, PA  Prenatal Vit-Fe Fumarate-FA (PRENATAL VITAMINS PO)     [provider]  triamcinolone  cream (KENALOG ) 0.1 % Apply 1 Application topically 2 (two) times daily. 12/20/21   Gladis Mustard, FNP  VITAMIN D PO     [provider]    Allergies: Sulfa antibiotics    Review of Systems  Constitutional:  Negative for fever.  HENT:  Negative for trouble swallowing and voice change.        Throat itchiness  Respiratory:  Positive for shortness of breath and wheezing.   Cardiovascular:  Negative for chest pain.  Gastrointestinal:  Negative for abdominal pain, diarrhea and vomiting.  Skin:  Positive for rash.   Neurological:  Negative for headaches.    Updated Vital Signs BP (!) 136/96 (BP Location: Right Arm)   Pulse 89   Temp 98.4 F (36.9 C) (Oral)   Resp 19   Ht 5' 3 (1.6 m)   Wt 90.3 kg   LMP 06/11/2023   SpO2 98%   BMI 35.26 kg/m   Physical Exam Vitals and nursing note reviewed.  Constitutional:      General: She is not in acute distress.    Appearance: Normal appearance.  HENT:     Head: Normocephalic.     Mouth/Throat:     Mouth: Mucous membranes are moist.     Comments: Controlling secretions Neck:     Comments: No stridor Cardiovascular:     Rate and Rhythm: Normal rate.  Pulmonary:     Effort: Pulmonary effort is normal. No respiratory distress.     Breath sounds: No wheezing.  Abdominal:     Palpations: Abdomen is soft.     Tenderness: There is no abdominal tenderness.   Skin:    General: Skin is warm.     Comments: Localized circular raised erythema at the site of the bite.  Also has scattered papular itchy rash on abdomen, upper back.   Neurological:     Mental Status: She is alert and oriented to person, place, and time. Mental status is at baseline.   Psychiatric:        Mood and  Affect: Mood normal.     (all labs ordered are listed, but only abnormal results are displayed) Labs Reviewed - No data to display  EKG: None  Radiology: No results found.   Procedures   Medications Ordered in the ED  sodium chloride 0.9 % bolus 1,000 mL (has no administration in time range)  famotidine (PEPCID) IVPB 20 mg premix (has no administration in time range)  methylPREDNISolone  sodium succinate (SOLU-MEDROL ) 125 mg/2 mL injection 125 mg (has no administration in time range)                                    Medical Decision Making Amount and/or Complexity of Data Reviewed Labs: ordered.  Risk Prescription drug management.   33 year old female presented to the emergency department an allergic reaction.  Unclear what stung the patient on the  right thigh but she had immediate skin reaction with shortness of breath/wheezing as well as itchiness in the throat.  Took Benadryl prior to arrival.  On her arrival vitals are stable.  She has an area of localized reaction to what appears to be a bug bite as well as papular rash on the torso.  No wheezing, no nausea/vomiting.  She has itchiness of her throat without any significant findings on physical exam.  Patient was given IV fluids, medications.  On reevaluation she feels much improved.  Reaction around the bug bite is improving.  Will plan to send with symptomatic treatment for the next couple days as well as an EpiPen and refer to allergy clinic.  Patient at this time appears safe and stable for discharge and close outpatient follow up. Discharge plan and strict return to ED precautions discussed, patient verbalizes understanding and agreement.     Final diagnoses:  None    ED Discharge Orders     None          Bari Roxie HERO, DO 06/27/23 1315

## 2023-06-27 NOTE — ED Triage Notes (Signed)
 Pt arrived reporting insect bite on R posterior thigh. States she was given benadryl 0830 50mg . Then another 25mg  at work. Endorses rash starting on face and abdomen and advised to come to ED. States tongue started to swell when it happened at 0800. No other symtpoms reported

## 2023-06-27 NOTE — Discharge Instructions (Addendum)
 You have been seen and discharged from the emergency department.  I believe you suffered from an anaphylactic reaction.  Continue taking 25-50 mg of Benadryl every 6-8 hours for the next 5 days.  Take prednisone  steroid prescription as directed, starting tomorrow 6/26.  Stay well-hydrated.  Fill the EpiPen prescription and have with you for possible further reactions.  Use at the sign of any severe allergic reaction.  Follow-up with your primary provider for further evaluation and further care. Take home medications as prescribed. If you have any worsening symptoms or further concerns for your health please return to an emergency department for further evaluation.

## 2023-06-28 ENCOUNTER — Ambulatory Visit: Admitting: Allergy

## 2023-06-28 ENCOUNTER — Encounter: Payer: Self-pay | Admitting: Allergy

## 2023-06-28 ENCOUNTER — Other Ambulatory Visit: Payer: Self-pay

## 2023-06-28 VITALS — BP 126/88 | HR 71 | Temp 98.3°F | Resp 18 | Ht 63.25 in | Wt 205.8 lb

## 2023-06-28 DIAGNOSIS — K9049 Malabsorption due to intolerance, not elsewhere classified: Secondary | ICD-10-CM

## 2023-06-28 DIAGNOSIS — J3089 Other allergic rhinitis: Secondary | ICD-10-CM | POA: Diagnosis not present

## 2023-06-28 DIAGNOSIS — T50995D Adverse effect of other drugs, medicaments and biological substances, subsequent encounter: Secondary | ICD-10-CM

## 2023-06-28 DIAGNOSIS — T7840XD Allergy, unspecified, subsequent encounter: Secondary | ICD-10-CM | POA: Diagnosis not present

## 2023-06-28 DIAGNOSIS — R062 Wheezing: Secondary | ICD-10-CM

## 2023-06-28 DIAGNOSIS — T7840XA Allergy, unspecified, initial encounter: Secondary | ICD-10-CM

## 2023-06-28 DIAGNOSIS — Z91038 Other insect allergy status: Secondary | ICD-10-CM | POA: Diagnosis not present

## 2023-06-28 MED ORDER — AIRSUPRA 90-80 MCG/ACT IN AERO: 2.0000 | INHALATION_SPRAY | RESPIRATORY_TRACT | 2 refills | Status: AC | PRN

## 2023-06-28 NOTE — Progress Notes (Signed)
 New Patient Note  RE: Heather Arias MRN: [id number] DOB: October 30, 1990 Date of Office Visit: 06/28/2023  Consult requested by: Lavell Bari LABOR, FNP Primary care provider: Lavell Bari LABOR, FNP  Chief Complaint: Establish Care (Establish care, ed follow up stinging insect has epi pen on ceterizine every day and benadryl as needed)  History of Present Illness: I had the pleasure of seeing Heather Arias for initial evaluation at the [facility] on 06/28/2023. She is a 33 y.o. female, who is referred here by the ER for the evaluation of allergic reaction.  She is accompanied today by her husband who provided/contributed to the history.   Discussed the use of AI scribe software for clinical note transcription with the patient, who gave verbal consent to proceed.    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 Within 30 minutes, she developed symptoms including feeling hot, a sore throat, and coughing. The on-site medical team administered two doses of Benadryl and provided an EpiPen for future use. Despite these measures, she developed a rash starting on her collarbone and spreading to her stomach, prompting a third dose of Benadryl. The rash continued to spread, leading to her being taken to the ER approximately two hours post-sting.  At the ER, she received a saline flush, prednisone , and famotidine. Her breathing improved within an hour of receiving Benadryl, but the rash persisted. As of today, the rash has resolved, though she reports residual throat soreness and mild itching. She has not yet picked up her prescribed EpiPen. She has been taking Benadryl as needed, with doses at 6 PM, 12 AM, and 7:30 AM.  Her past medical history includes polycystic ovary syndrome (PCOS) and infertility issues. She has a history of wheezing related to dog exposure, which is controlled  with daily cetirizine. She also reports a gluten sensitivity causing gastrointestinal issues and eczema, and an allergy to sulfa medications, which causes her eyes to swell shut and induces coughing when exposed to sulfa dust.  Family history is notable for a brother with asthma. She denies any history of asthma herself, though she has experienced wheezing in the past related to dog exposure. She has no history of using inhalers.  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 She does not smoke and has two cats and two dogs. She takes cetirizine daily and uses Prilosec as needed for heartburn.     No prior reactions to previous stings.   06/27/2023 ER visit:  33 year old female presents emergency department with what appears to be an allergic reaction.  Patient was bit by something on her right lateral thigh while she was outside working around the barn.  She states that she immediately had redness and a local reaction which progressed to body rash, shortness of breath/wheezing, itchiness in the throat.  She took 2 doses of Benadryl prior to arrival.  No history of anaphylactic reactions, does not use EpiPen.  After Benadryl the breathing and throat feels improved but she still has some scratchiness in her throat along with body rash and itching.   Assessment and Plan: [name] is a 33 y.o. female with: Allergic reaction, initial encounter Hymenoptera allergy Acute allergic reaction to insect sting with rash, wheezing, and sore throat. Symptoms improved with Benadryl and prednisone . Differential includes reactions to various stinging insects. Take prednisone   as prescribed. Take the below meds for the next 1 week instead of benadryl:  Start zyrtec (cetirizine) 10mg  OR allegra (fexofenadine) 180mg  twice a day. If symptoms are not controlled or causes drowsiness let us  know. Start Pepcid (famotidine) 20mg  twice a day.  For mild  symptoms you can take over the counter antihistamines (zyrtec 10mg  to 20mg ) and monitor symptoms closely.  If symptoms worsen or if you have severe symptoms including breathing issues, throat closure, significant swelling, whole body hives, severe diarrhea and vomiting, lightheadedness then use epinephrine and seek immediate medical care afterwards. Emergency action plan given. Get bloodwork If positive will recommend allergy injections. If negative will recommend skin testing that is done once a month at our [facility] office for the stinging insects.  Wheezing Wheezing at times especially when she got new pets and with above episode. No prior inhaler use. Today's spirometry was normal. May use Airsupra rescue inhaler 2 puffs every 4 to 6 hours as needed for shortness of breath, chest tightness, coughing, and wheezing. Do not use more than 12 puffs in 24 hours. May use Airsupra rescue inhaler 2 puffs 5 to 15 minutes prior to strenuous physical activities. Rinse mouth after each use.  Coupon given.  Monitor frequency of use - if you need to use it more than twice per week on a consistent basis let us  know.   Other allergic rhinitis No recent testing. Takes daily zyrtec with good benefit. Works at a barn. Return for allergy skin testing in the future if interested.  Will make additional recommendations based on results. Make sure you don't take any antihistamines for 3 days before the skin testing appointment. Don't put any lotion on the back and arms on the day of testing.  Must be in good health and not ill. No vaccines/injections/antibiotics within the past 7 days.  Plan on being here for 30-60 minutes. Use over the counter antihistamines such as Zyrtec (cetirizine), Claritin (loratadine), Allegra (fexofenadine), or Xyzal (levocetirizine) daily as needed. May take twice a day during allergy flares. May switch antihistamines every few months.  Food intolerance Gluten causing eczema and  gastrointestinal symptoms. Symptoms improved with gluten-free diet. Continue to avoid gluten. Consider allergy testing in future.   Adverse effect of other drugs, medicaments and biological substances, subsequent encounter Allergy to sulfa medications causing eye swelling and respiratory symptoms. Continue to avoid sulfa.   Return in about 1 year (around 06/27/2024).  Meds ordered this encounter  Medications   Albuterol-Budesonide (AIRSUPRA) 90-80 MCG/ACT AERO    Sig: Inhale 2 puffs into the lungs every 4 (four) hours as needed (coughing, wheezing, chest tightness). Do not exceed 12 puffs in 24 hours.    Dispense:  10.7 g    Refill:  2    BIN: [id number], PCN: [id number], GRP: [id number], ID [id number]   Lab Orders         Allergen Fire Ant         Hymenoptera Venom Allergy II      Other allergy screening: Asthma: no Wheezing in the past with no prior inhaler use. Rhino conjunctivitis: yes Skin testing at age 18 showed positives. No prior AIT.  Food allergy: no Medication allergy:  Sulfa Urticaria: no Eczema:yes History of recurrent infections suggestive of immunodeficency: no  Diagnostics: Spirometry:  Tracings reviewed. Her effort: Good reproducible efforts. FVC: 4.27L FEV1: 3.44L, 111% predicted FEV1/FVC ratio: 81% Interpretation: Spirometry consistent with normal pattern.  Please see scanned spirometry results for details.  Results discussed with patient/family.  Past Medical History: Patient Active Problem List   Diagnosis Date Noted   Weight gain 09/16/2018   Obese 09/16/2018   PCOS (polycystic ovarian syndrome) 09/06/2018   Fatigue 12/22/2014   Past Medical History:  Diagnosis Date   PCOS (polycystic ovarian syndrome)    Past Surgical History: Past Surgical History:  Procedure Laterality Date   JOINT REPLACEMENT     WISDOM TOOTH EXTRACTION  04/03/2015   Medication List:  Current Outpatient Medications  Medication Sig Dispense Refill    Albuterol-Budesonide (AIRSUPRA) 90-80 MCG/ACT AERO Inhale 2 puffs into the lungs every 4 (four) hours as needed (coughing, wheezing, chest tightness). Do not exceed 12 puffs in 24 hours. 10.7 g 2   ELDERBERRY PO      EPINEPHrine 0.3 mg/0.3 mL IJ SOAJ injection Inject 0.3 mg into the muscle as needed for anaphylaxis. 1 each 0   ibuprofen  (ADVIL ,MOTRIN ) 800 MG tablet Take 1 tablet (800 mg total) by mouth every 8 (eight) hours as needed. 30 tablet 0   Lysine 1000 MG TABS Take by mouth.     omeprazole  (PRILOSEC) 40 MG capsule TAKE 1 CAPSULE BY MOUTH TWICE A DAY 60 capsule 3   predniSONE  (DELTASONE ) 10 MG tablet Take 2 tablets (20 mg total) by mouth daily. 10 tablet 0   Prenatal Vit-Fe Fumarate-FA (PRENATAL VITAMINS PO)      triamcinolone  cream (KENALOG ) 0.1 % Apply 1 Application topically 2 (two) times daily. 453.6 g 0   VITAMIN D PO      No current facility-administered medications for this visit.   Allergies: Allergies  Allergen Reactions   Sulfa Antibiotics Anaphylaxis   Gluten Meal Dermatitis, Itching, Rash and Swelling   Social History: Social History   Socioeconomic History   Marital status: Married    Spouse name: Not on file   Number of children: Not on file   Years of education: Not on file   Highest education level: Not on file  Occupational History   Not on file  Tobacco Use   Smoking status: Never    Passive exposure: Current   Smokeless tobacco: Never  Substance and Sexual Activity   Alcohol use: Yes   Drug use: Never   Sexual activity: Not on file  Other Topics Concern   Not on file  Social History Narrative   Not on file   Social Drivers of Health   Financial Resource Strain: Not on file  Food Insecurity: Not on file  Transportation Needs: Not on file  Physical Activity: Not on file  Stress: Not on file  Social Connections: Not on file   Lives in a 33 year old house. Smoking: denies Occupation: Merchandiser, retail HistoryArt gallery manager in the house: no Engineer, civil (consulting) in the family room: no Carpet in the bedroom: no Heating: electric Cooling: window Pet: yes 2 cats, 2 dogs  Family History: Family History  Problem Relation Age of Onset   Asthma Brother    Heart disease Maternal Grandmother    Review of Systems  Constitutional:  Negative for appetite change, chills, fever and unexpected weight change.  HENT:  Negative for congestion and rhinorrhea.   Eyes:  Negative for itching.  Respiratory:  Negative for cough, chest tightness, shortness of breath and wheezing.   Cardiovascular:  Negative for chest pain.  Gastrointestinal:  Negative for abdominal pain.  Genitourinary:  Negative for difficulty urinating.  Skin:  Positive for rash.  Neurological:  Negative for headaches.    Objective: BP 126/88  Pulse 71   Temp 98.3 F (36.8 C) (Temporal)   Resp 18   Ht 5' 3.25 (1.607 m)   Wt 205 lb 12.8 oz (93.4 kg)   LMP 06/11/2023   SpO2 98%   BMI 36.17 kg/m  Body mass index is 36.17 kg/m. Physical Exam Vitals and nursing note reviewed.  Constitutional:      Appearance: Normal appearance. She is well-developed.  HENT:     Head: Normocephalic and atraumatic.     Right Ear: Tympanic membrane and external ear normal.     Left Ear: Tympanic membrane and external ear normal.     Nose: Nose normal.     Mouth/Throat:     Mouth: Mucous membranes are moist.     Pharynx: Oropharynx is clear.   Eyes:     Conjunctiva/sclera: Conjunctivae normal.    Cardiovascular:     Rate and Rhythm: Normal rate and regular rhythm.     Heart sounds: Normal heart sounds. No murmur heard.    No friction rub. No gallop.  Pulmonary:     Effort: Pulmonary effort is normal.     Breath sounds: Normal breath sounds. No wheezing, rhonchi or rales.   Musculoskeletal:     Cervical back: Neck supple.   Skin:    General: Skin is warm.     Findings: No rash.   Neurological:     Mental Status: She is alert and oriented to  person, place, and time.   Psychiatric:        Behavior: Behavior normal.    The plan was reviewed with the patient/family, and all questions/concerned were addressed.  It was my pleasure to see Shamieka today and participate in her care. Please feel free to contact me with any questions or concerns.  Sincerely,  Orlan Cramp, DO Allergy & Immunology  [facility]  [place] office: [phone] [facility] office: [phone]

## 2023-06-28 NOTE — Patient Instructions (Addendum)
 Allergic reaction Take prednisone  as prescribed. Take the below meds for the next 1 week instead of benadryl:  Start zyrtec (cetirizine) 10mg  OR allegra (fexofenadine) 180mg  twice a day. If symptoms are not controlled or causes drowsiness let us  know. Start Pepcid (famotidine) 20mg  twice a day.   For mild symptoms you can take over the counter antihistamines (zyrtec 10mg  to 20mg ) and monitor symptoms closely.  If symptoms worsen or if you have severe symptoms including breathing issues, throat closure, significant swelling, whole body hives, severe diarrhea and vomiting, lightheadedness then use epinephrine and seek immediate medical care afterwards. Emergency action plan given. Get bloodwork If positive will recommend allergy injections. If negative will recommend skin testing that is done once a month at our [facility] office for the stinging insects. We are ordering labs, so please allow 1-2 weeks for the results to come back. With the newly implemented Cures Act, the labs might be visible to you at the same time that they become visible to me. However, I will not address the results until all of the results are back, so please be patient.  In the meantime, continue recommendations in your patient instructions, including avoidance measures (if applicable), until you hear from me.  Wheezing Normal breathing test today. May use Airsupra rescue inhaler 2 puffs every 4 to 6 hours as needed for shortness of breath, chest tightness, coughing, and wheezing. Do not use more than 12 puffs in 24 hours. May use Airsupra rescue inhaler 2 puffs 5 to 15 minutes prior to strenuous physical activities. Rinse mouth after each use.  Coupon given.  Monitor frequency of use - if you need to use it more than twice per week on a consistent basis let us  know.   Rhinitis  Return for allergy skin testing in the future if interested.  Will make additional recommendations based on results. Make sure you don't take  any antihistamines for 3 days before the skin testing appointment. Don't put any lotion on the back and arms on the day of testing.  Must be in good health and not ill. No vaccines/injections/antibiotics within the past 7 days.  Plan on being here for 30-60 minutes. Use over the counter antihistamines such as Zyrtec (cetirizine), Claritin (loratadine), Allegra (fexofenadine), or Xyzal (levocetirizine) daily as needed. May take twice a day during allergy flares. May switch antihistamines every few months.  Medication allergy Continue to avoid sulfa.  Food Continue to avoid gluten. Consider allergy testing in future.   Follow up in 1 year or sooner depending on lab results.

## 2023-06-29 ENCOUNTER — Telehealth: Payer: Self-pay

## 2023-06-29 NOTE — Telephone Encounter (Signed)
 Patient called and stated that she was not feeling any better. She stated she is tired, winded and has a scratchy throat and was wondering how long the symptoms might last. I informed patient symptoms can last a few days or longer and to continue with her Zyrtec or allegra twice a day and her Pepcid  twice a day. Patient also asked if she should use her inhaler when she is winded. I informed patient she should use her inhaler every 4-6 hours as needed for when she is winded. Patient verbalized understanding. Dr. Luke please advise if there is anything else she can do.

## 2023-06-29 NOTE — Telephone Encounter (Signed)
 If she has any worsening symptoms then recommend to be seen by PCP or UC.  But yes, after having an allergic reaction - you can feel worn down for a few days.  Make sure she is taking all the medications as we discussed at the last visit.

## 2023-07-02 ENCOUNTER — Ambulatory Visit: Payer: Self-pay | Admitting: Allergy

## 2023-07-02 LAB — HYMENOPTERA VENOM ALLERGY II
Bumblebee: 0.51 kU/L — AB
Hornet, White Face, IgE: 0.51 kU/L — AB
Hornet, Yellow, IgE: 0.23 kU/L — AB
I001-IgE Honeybee: 0.87 kU/L — AB
I003-IgE Yellow Jacket: 0.78 kU/L — AB
I004-IgE Paper Wasp: 1.19 kU/L — AB
I208-IgE Api m 1: <0.1 kU/L
I209-IgE Ves v 5: 0.72 kU/L — AB
I210-IgE Pol d 5: 1.04 kU/L — AB
I211-IgE Ves v 1: <0.1 kU/L
I214-IgE Api m 2: 1.71 kU/L — AB
I215-IgE Api m 3: <0.1 kU/L
I216-IgE Api m 5: 0.21 kU/L — AB
I217-IgE Api m 10: <0.1 kU/L
Tryptase: 2.5 ug/L (ref 2.2–13.2)

## 2023-07-02 LAB — ALLERGEN FIRE ANT: I070-IgE Fire Ant (Invicta): 0.97 kU/L — AB

## 2023-07-02 LAB — ALLERGEN COMPONENT COMMENTS

## 2023-07-03 ENCOUNTER — Other Ambulatory Visit: Payer: Self-pay | Admitting: Allergy

## 2023-07-03 DIAGNOSIS — Z91038 Other insect allergy status: Secondary | ICD-10-CM

## 2023-07-19 ENCOUNTER — Telehealth: Payer: Self-pay | Admitting: Allergy

## 2023-07-27 ENCOUNTER — Ambulatory Visit

## 2023-07-27 DIAGNOSIS — Z91038 Other insect allergy status: Secondary | ICD-10-CM

## 2023-07-27 NOTE — Progress Notes (Signed)
 Immunotherapy   Patient Details  Name: Heather Arias MRN: [id number] Date of Birth: June 18, 1990  07/27/2023  Heather Arias started injections for  Mixed Vespid-Honey Bee-Wasp  Frequency: Weekly Epi-Pen:Epi-Pen Available  Consent signed and patient instructions given. Patient waited in office 30 minutes with no issues.    Isaiah LITTIE Shed 07/27/2023, 3:14 PM

## 2023-08-02 ENCOUNTER — Ambulatory Visit (INDEPENDENT_AMBULATORY_CARE_PROVIDER_SITE_OTHER)

## 2023-08-02 DIAGNOSIS — Z91038 Other insect allergy status: Secondary | ICD-10-CM

## 2023-08-09 ENCOUNTER — Ambulatory Visit (INDEPENDENT_AMBULATORY_CARE_PROVIDER_SITE_OTHER)

## 2023-08-09 DIAGNOSIS — Z91038 Other insect allergy status: Secondary | ICD-10-CM

## 2023-08-14 ENCOUNTER — Ambulatory Visit (INDEPENDENT_AMBULATORY_CARE_PROVIDER_SITE_OTHER)

## 2023-08-14 DIAGNOSIS — Z91038 Other insect allergy status: Secondary | ICD-10-CM | POA: Diagnosis not present

## 2023-08-16 ENCOUNTER — Ambulatory Visit

## 2023-08-21 ENCOUNTER — Ambulatory Visit (INDEPENDENT_AMBULATORY_CARE_PROVIDER_SITE_OTHER)

## 2023-08-21 DIAGNOSIS — Z91038 Other insect allergy status: Secondary | ICD-10-CM

## 2023-08-28 ENCOUNTER — Ambulatory Visit (INDEPENDENT_AMBULATORY_CARE_PROVIDER_SITE_OTHER)

## 2023-08-28 DIAGNOSIS — Z91038 Other insect allergy status: Secondary | ICD-10-CM

## 2023-09-04 ENCOUNTER — Ambulatory Visit (INDEPENDENT_AMBULATORY_CARE_PROVIDER_SITE_OTHER)

## 2023-09-04 DIAGNOSIS — Z91038 Other insect allergy status: Secondary | ICD-10-CM | POA: Diagnosis not present

## 2023-09-04 DIAGNOSIS — T63444D Toxic effect of venom of bees, undetermined, subsequent encounter: Secondary | ICD-10-CM

## 2023-09-11 ENCOUNTER — Ambulatory Visit (INDEPENDENT_AMBULATORY_CARE_PROVIDER_SITE_OTHER)

## 2023-09-11 DIAGNOSIS — Z91038 Other insect allergy status: Secondary | ICD-10-CM

## 2023-09-18 ENCOUNTER — Ambulatory Visit (INDEPENDENT_AMBULATORY_CARE_PROVIDER_SITE_OTHER)

## 2023-09-18 DIAGNOSIS — Z91038 Other insect allergy status: Secondary | ICD-10-CM | POA: Diagnosis not present

## 2023-09-25 ENCOUNTER — Ambulatory Visit (INDEPENDENT_AMBULATORY_CARE_PROVIDER_SITE_OTHER)

## 2023-09-25 DIAGNOSIS — Z91038 Other insect allergy status: Secondary | ICD-10-CM | POA: Diagnosis not present

## 2023-10-02 ENCOUNTER — Ambulatory Visit (INDEPENDENT_AMBULATORY_CARE_PROVIDER_SITE_OTHER)

## 2023-10-02 DIAGNOSIS — Z91038 Other insect allergy status: Secondary | ICD-10-CM | POA: Diagnosis not present

## 2023-10-09 ENCOUNTER — Ambulatory Visit

## 2023-10-09 DIAGNOSIS — Z91038 Other insect allergy status: Secondary | ICD-10-CM

## 2023-10-16 ENCOUNTER — Ambulatory Visit (INDEPENDENT_AMBULATORY_CARE_PROVIDER_SITE_OTHER)

## 2023-10-16 DIAGNOSIS — Z91038 Other insect allergy status: Secondary | ICD-10-CM

## 2023-10-23 ENCOUNTER — Ambulatory Visit (INDEPENDENT_AMBULATORY_CARE_PROVIDER_SITE_OTHER)

## 2023-10-23 DIAGNOSIS — Z91038 Other insect allergy status: Secondary | ICD-10-CM

## 2023-10-30 ENCOUNTER — Ambulatory Visit (INDEPENDENT_AMBULATORY_CARE_PROVIDER_SITE_OTHER)

## 2023-10-30 DIAGNOSIS — Z91038 Other insect allergy status: Secondary | ICD-10-CM | POA: Diagnosis not present

## 2023-11-06 ENCOUNTER — Ambulatory Visit

## 2023-11-08 ENCOUNTER — Ambulatory Visit (INDEPENDENT_AMBULATORY_CARE_PROVIDER_SITE_OTHER)

## 2023-11-08 DIAGNOSIS — Z91038 Other insect allergy status: Secondary | ICD-10-CM | POA: Diagnosis not present

## 2023-11-13 ENCOUNTER — Ambulatory Visit (INDEPENDENT_AMBULATORY_CARE_PROVIDER_SITE_OTHER)

## 2023-11-13 DIAGNOSIS — Z91038 Other insect allergy status: Secondary | ICD-10-CM | POA: Diagnosis not present

## 2023-11-20 ENCOUNTER — Ambulatory Visit (INDEPENDENT_AMBULATORY_CARE_PROVIDER_SITE_OTHER)

## 2023-11-20 DIAGNOSIS — Z91038 Other insect allergy status: Secondary | ICD-10-CM | POA: Diagnosis not present

## 2023-12-05 ENCOUNTER — Ambulatory Visit
